# Patient Record
Sex: Female | Born: 1935 | Race: White | Hispanic: No | Smoking: Never smoker
Health system: Southern US, Community
[De-identification: ages and names within clinical notes are randomized; demographics above are authoritative.]

## PROBLEM LIST (undated history)

## (undated) DIAGNOSIS — R42 Dizziness and giddiness: Secondary | ICD-10-CM

## (undated) DIAGNOSIS — G510 Bell's palsy: Secondary | ICD-10-CM

## (undated) DIAGNOSIS — E119 Type 2 diabetes mellitus without complications: Secondary | ICD-10-CM

## (undated) DIAGNOSIS — I639 Cerebral infarction, unspecified: Secondary | ICD-10-CM

## (undated) DIAGNOSIS — I10 Essential (primary) hypertension: Secondary | ICD-10-CM

## (undated) HISTORY — PX: BREAST SURGERY: SHX581

## (undated) HISTORY — PX: TONSILLECTOMY AND ADENOIDECTOMY: SHX28

## (undated) HISTORY — DX: Cerebral infarction, unspecified: I63.9

## (undated) HISTORY — DX: Bell's palsy: G51.0

---

## 1962-04-24 HISTORY — PX: BREAST EXCISIONAL BIOPSY: SUR124

## 1983-04-25 DIAGNOSIS — G510 Bell's palsy: Secondary | ICD-10-CM

## 1983-04-25 HISTORY — DX: Bell's palsy: G51.0

## 2004-02-29 ENCOUNTER — Ambulatory Visit: Payer: Self-pay

## 2005-08-25 ENCOUNTER — Ambulatory Visit: Payer: Self-pay

## 2008-03-18 ENCOUNTER — Ambulatory Visit: Payer: Self-pay

## 2010-02-11 ENCOUNTER — Ambulatory Visit: Payer: Self-pay

## 2012-01-16 ENCOUNTER — Ambulatory Visit: Payer: Self-pay

## 2014-04-08 ENCOUNTER — Ambulatory Visit: Payer: Self-pay

## 2014-08-27 ENCOUNTER — Inpatient Hospital Stay (HOSPITAL_COMMUNITY)
Admission: EM | Admit: 2014-08-27 | Discharge: 2014-09-01 | DRG: 064 | Disposition: A | Discharge status: Home Health | Payer: Medicare Other | Attending: Neurology | Admitting: Neurology

## 2014-08-27 ENCOUNTER — Encounter (HOSPITAL_COMMUNITY): Payer: Self-pay | Admitting: Emergency Medicine

## 2014-08-27 ENCOUNTER — Emergency Department (HOSPITAL_COMMUNITY): Payer: Medicare Other

## 2014-08-27 DIAGNOSIS — I440 Atrioventricular block, first degree: Secondary | ICD-10-CM | POA: Diagnosis present

## 2014-08-27 DIAGNOSIS — G8194 Hemiplegia, unspecified affecting left nondominant side: Secondary | ICD-10-CM | POA: Diagnosis present

## 2014-08-27 DIAGNOSIS — E119 Type 2 diabetes mellitus without complications: Secondary | ICD-10-CM

## 2014-08-27 DIAGNOSIS — I6789 Other cerebrovascular disease: Secondary | ICD-10-CM | POA: Diagnosis not present

## 2014-08-27 DIAGNOSIS — M542 Cervicalgia: Secondary | ICD-10-CM | POA: Diagnosis present

## 2014-08-27 DIAGNOSIS — I442 Atrioventricular block, complete: Secondary | ICD-10-CM | POA: Diagnosis not present

## 2014-08-27 DIAGNOSIS — E785 Hyperlipidemia, unspecified: Secondary | ICD-10-CM | POA: Diagnosis not present

## 2014-08-27 DIAGNOSIS — R001 Bradycardia, unspecified: Secondary | ICD-10-CM | POA: Diagnosis present

## 2014-08-27 DIAGNOSIS — I63012 Cerebral infarction due to thrombosis of left vertebral artery: Secondary | ICD-10-CM | POA: Diagnosis not present

## 2014-08-27 DIAGNOSIS — I7774 Dissection of vertebral artery: Secondary | ICD-10-CM | POA: Diagnosis present

## 2014-08-27 DIAGNOSIS — I6330 Cerebral infarction due to thrombosis of unspecified cerebral artery: Secondary | ICD-10-CM | POA: Diagnosis present

## 2014-08-27 DIAGNOSIS — I63332 Cerebral infarction due to thrombosis of left posterior cerebral artery: Secondary | ICD-10-CM | POA: Diagnosis present

## 2014-08-27 DIAGNOSIS — R42 Dizziness and giddiness: Secondary | ICD-10-CM

## 2014-08-27 DIAGNOSIS — I10 Essential (primary) hypertension: Secondary | ICD-10-CM | POA: Diagnosis not present

## 2014-08-27 DIAGNOSIS — E118 Type 2 diabetes mellitus with unspecified complications: Secondary | ICD-10-CM

## 2014-08-27 DIAGNOSIS — Z79899 Other long term (current) drug therapy: Secondary | ICD-10-CM

## 2014-08-27 DIAGNOSIS — R112 Nausea with vomiting, unspecified: Secondary | ICD-10-CM

## 2014-08-27 DIAGNOSIS — E1142 Type 2 diabetes mellitus with diabetic polyneuropathy: Secondary | ICD-10-CM | POA: Diagnosis present

## 2014-08-27 DIAGNOSIS — E1159 Type 2 diabetes mellitus with other circulatory complications: Secondary | ICD-10-CM | POA: Diagnosis not present

## 2014-08-27 DIAGNOSIS — I6340 Cerebral infarction due to embolism of unspecified cerebral artery: Secondary | ICD-10-CM

## 2014-08-27 HISTORY — DX: Dizziness and giddiness: R42

## 2014-08-27 HISTORY — DX: Essential (primary) hypertension: I10

## 2014-08-27 HISTORY — DX: Type 2 diabetes mellitus without complications: E11.9

## 2014-08-27 LAB — HEPATIC FUNCTION PANEL
ALBUMIN: 3.9 g/dL (ref 3.5–5.0)
ALT: 14 U/L (ref 14–54)
AST: 19 U/L (ref 15–41)
Alkaline Phosphatase: 54 U/L (ref 38–126)
BILIRUBIN DIRECT: 0.2 mg/dL (ref 0.1–0.5)
BILIRUBIN TOTAL: 1 mg/dL (ref 0.3–1.2)
Indirect Bilirubin: 0.8 mg/dL (ref 0.3–0.9)
Total Protein: 6.6 g/dL (ref 6.5–8.1)

## 2014-08-27 LAB — BASIC METABOLIC PANEL
Anion gap: 11 (ref 5–15)
BUN: 16 mg/dL (ref 6–20)
CALCIUM: 9 mg/dL (ref 8.9–10.3)
CHLORIDE: 107 mmol/L (ref 101–111)
CO2: 21 mmol/L — AB (ref 22–32)
CREATININE: 0.78 mg/dL (ref 0.44–1.00)
GFR calc Af Amer: >60 mL/min (ref >60)
Glucose, Bld: 265 mg/dL — ABNORMAL HIGH (ref 70–99)
POTASSIUM: 3.5 mmol/L (ref 3.5–5.1)
Sodium: 139 mmol/L (ref 135–145)

## 2014-08-27 LAB — HEPARIN LEVEL (UNFRACTIONATED): Heparin Unfractionated: 0.27 IU/mL — ABNORMAL LOW (ref 0.30–0.70)

## 2014-08-27 LAB — URINE MICROSCOPIC-ADD ON

## 2014-08-27 LAB — CBC WITH DIFFERENTIAL/PLATELET
BASOS ABS: 0.1 10*3/uL (ref 0.0–0.1)
Basophils Relative: 1 % (ref 0–1)
Eosinophils Absolute: 0.2 10*3/uL (ref 0.0–0.7)
Eosinophils Relative: 1 % (ref 0–5)
HEMATOCRIT: 39.2 % (ref 36.0–46.0)
Hemoglobin: 13.2 g/dL (ref 12.0–15.0)
LYMPHS PCT: 14 % (ref 12–46)
Lymphs Abs: 2.3 10*3/uL (ref 0.7–4.0)
MCH: 29.9 pg (ref 26.0–34.0)
MCHC: 33.7 g/dL (ref 30.0–36.0)
MCV: 88.9 fL (ref 78.0–100.0)
Monocytes Absolute: 0.7 10*3/uL (ref 0.1–1.0)
Monocytes Relative: 4 % (ref 3–12)
NEUTROS PCT: 80 % — AB (ref 43–77)
Neutro Abs: 12.6 10*3/uL — ABNORMAL HIGH (ref 1.7–7.7)
PLATELETS: 226 10*3/uL (ref 150–400)
RBC: 4.41 MIL/uL (ref 3.87–5.11)
RDW: 12.8 % (ref 11.5–15.5)
WBC: 15.7 10*3/uL — AB (ref 4.0–10.5)

## 2014-08-27 LAB — URINALYSIS, ROUTINE W REFLEX MICROSCOPIC
Bilirubin Urine: NEGATIVE
Glucose, UA: >1000 mg/dL — AB
Hgb urine dipstick: NEGATIVE
Ketones, ur: 40 mg/dL — AB
LEUKOCYTES UA: NEGATIVE
Nitrite: NEGATIVE
Protein, ur: NEGATIVE mg/dL
Specific Gravity, Urine: 1.026 (ref 1.005–1.030)
Urobilinogen, UA: 0.2 mg/dL (ref 0.0–1.0)
pH: 6.5 (ref 5.0–8.0)

## 2014-08-27 LAB — PROTIME-INR
INR: 1.05 (ref 0.00–1.49)
PROTHROMBIN TIME: 13.8 s (ref 11.6–15.2)

## 2014-08-27 LAB — GLUCOSE, CAPILLARY
GLUCOSE-CAPILLARY: 146 mg/dL — AB (ref 70–99)
Glucose-Capillary: 145 mg/dL — ABNORMAL HIGH (ref 70–99)
Glucose-Capillary: 150 mg/dL — ABNORMAL HIGH (ref 70–99)
Glucose-Capillary: 165 mg/dL — ABNORMAL HIGH (ref 70–99)

## 2014-08-27 LAB — MAGNESIUM: Magnesium: 1.7 mg/dL (ref 1.7–2.4)

## 2014-08-27 LAB — MRSA PCR SCREENING: MRSA BY PCR: NEGATIVE

## 2014-08-27 LAB — TROPONIN I
Troponin I: <0.03 ng/mL (ref <0.031)
Troponin I: 0.32 ng/mL — ABNORMAL HIGH (ref <0.031)

## 2014-08-27 LAB — LIPASE, BLOOD: Lipase: 33 U/L (ref 22–51)

## 2014-08-27 MED ORDER — DIAZEPAM 5 MG/ML IJ SOLN
2.5000 mg | Freq: Once | INTRAMUSCULAR | Status: AC
  Administered 2014-08-27: 2.5 mg via INTRAVENOUS
  Filled 2014-08-27: qty 2

## 2014-08-27 MED ORDER — ATROPINE SULFATE 0.1 MG/ML IJ SOLN
INTRAMUSCULAR | Status: AC
  Filled 2014-08-27: qty 10

## 2014-08-27 MED ORDER — INSULIN ASPART 100 UNIT/ML ~~LOC~~ SOLN
0.0000 [IU] | Freq: Three times a day (TID) | SUBCUTANEOUS | Status: DC
  Administered 2014-08-27: 3 [IU] via SUBCUTANEOUS
  Administered 2014-08-28: 2 [IU] via SUBCUTANEOUS
  Administered 2014-08-28: 8 [IU] via SUBCUTANEOUS

## 2014-08-27 MED ORDER — POTASSIUM CHLORIDE CRYS ER 20 MEQ PO TBCR
40.0000 meq | EXTENDED_RELEASE_TABLET | Freq: Once | ORAL | Status: AC
  Administered 2014-08-27: 40 meq via ORAL
  Filled 2014-08-27: qty 2

## 2014-08-27 MED ORDER — SENNOSIDES-DOCUSATE SODIUM 8.6-50 MG PO TABS
1.0000 | ORAL_TABLET | Freq: Every evening | ORAL | Status: DC | PRN
  Filled 2014-08-27: qty 1

## 2014-08-27 MED ORDER — HEPARIN (PORCINE) IN NACL 100-0.45 UNIT/ML-% IJ SOLN
950.0000 [IU]/h | INTRAMUSCULAR | Status: DC
  Administered 2014-08-27: 950 [IU]/h via INTRAVENOUS
  Filled 2014-08-27: qty 250

## 2014-08-27 MED ORDER — SODIUM CHLORIDE 0.9 % IV BOLUS (SEPSIS)
500.0000 mL | Freq: Once | INTRAVENOUS | Status: AC
  Administered 2014-08-27: 500 mL via INTRAVENOUS

## 2014-08-27 MED ORDER — IOHEXOL 350 MG/ML SOLN
80.0000 mL | Freq: Once | INTRAVENOUS | Status: AC | PRN
  Administered 2014-08-27: 80 mL via INTRAVENOUS

## 2014-08-27 MED ORDER — STROKE: EARLY STAGES OF RECOVERY BOOK
Freq: Once | Status: DC
  Filled 2014-08-27: qty 1

## 2014-08-27 MED ORDER — MAGNESIUM OXIDE 400 (241.3 MG) MG PO TABS
400.0000 mg | ORAL_TABLET | Freq: Two times a day (BID) | ORAL | Status: AC
  Administered 2014-08-27 – 2014-08-29 (×4): 400 mg via ORAL
  Filled 2014-08-27 (×5): qty 1

## 2014-08-27 NOTE — ED Notes (Signed)
Pt having dry heaves, heart rate dropped to 30's. Dr Johnney Killian at bedside.

## 2014-08-27 NOTE — ED Notes (Signed)
To ED via [facility] from home, with c/o episode of vertigo while on the commode-- hx of vertigo-- became dizzy, diaphoretic, nausea/vomiting/ diarrhea-- felt "a little dizzy when first woke up-- " states is unable to lay down all the way normally because it causes dizziness. States has never had vertigo "this bad".

## 2014-08-27 NOTE — ED Notes (Signed)
Tolerated laying flat for ct without any vomiting.

## 2014-08-27 NOTE — Consult Note (Signed)
Name: Holly [name] is a 79 y.o. female Admit date: 08/27/2014 Referring Physician:  Alexis Goodell, M.D. Primary Physician: Unknown Primary Cardiologist:  None  Reason for Consultation:  Heart block during vomiting  ASSESSMENT:  1. Bradycardia and high-grade AV heart block during vomiting. Patient does have baseline first-degree AV block on EKG 2. Acute vertebral artery dissection 3. Nausea and vomiting 4. Hypertension essential with questionable control over time 5. Diabetes mellitus, type II 6. Abnormal baseline EKG with mild ST elevation in lead 1 and aVL with reciprocal inferior ST segment depression. Rule out chronic baseline changes versus ischemia related  PLAN:  1. Bradycardia/heart block likely newly mediated/vasovagal due to associated nausea and vomiting 2. Treat nausea and vomiting 3. Serial cardiac markers to exclude myocardial injury 4. We will follow with you 5. 2-D Doppler echocardiogram to rule out wall motion abnormality 6. Repeat ECG   HPI: 79 year old patient who awakened with vertigo, gait instability, nausea, vomiting, and left-sided weakness. There was also associated left neck pain. Evaluation is identified a vertebral artery dissection. During episodes of nausea and vomiting the patient has had marked bradycardia with high-grade AV block. There is no prior history of myocardial infarction.. She denies chest pain currently. 4 years ago she did have cardiac evaluation and angiography was recommended but she refused. This workup was directed by Dr. Jyl Heinz in [facility]. Since that time she states an uncle morning's when she walks to the mailbox she will get a burning type sensation in her chest.   PMH:   Past Medical History  Diagnosis Date  . Vertigo   . Hypertension   . Diabetes mellitus without complication     non insulin dependent    PSH:  History reviewed. No pertinent past surgical history. Allergies:  Review of patient's  allergies indicates no known allergies. Prior to Admit Meds:   Prescriptions prior to admission  Medication Sig Dispense Refill Last Dose  . Coenzyme Q10 (CO Q 10) 10 MG CAPS Take 10 mg by mouth daily.   08/26/2014 at Unknown time  . escitalopram (LEXAPRO) 10 MG tablet Take 10 mg by mouth daily.   08/26/2014 at Unknown time  . losartan (COZAAR) 25 MG tablet Take 25 mg by mouth daily.   08/26/2014 at Unknown time  . metFORMIN (GLUCOPHAGE) 500 MG tablet Take 500 mg by mouth 2 (two) times daily with a meal.   08/26/2014 at Unknown time  . Red Yeast Rice 600 MG TABS Take 600 mg by mouth daily.   08/26/2014 at Unknown time   Fam HX:   No family history on file. Social HX:    History   Social History  . Marital Status: Married    Spouse Name: N/A  . Number of Children: N/A  . Years of Education: N/A   Occupational History  . Not on file.   Social History Main Topics  . Smoking status: Never Smoker   . Smokeless tobacco: Not on file  . Alcohol Use: No  . Drug Use: No  . Sexual Activity: Not on file   Other Topics Concern  . Not on file   Social History Narrative  . No narrative on file     Review of Systems: Admits to significant stress at home related to her husband requiring care. She is ordinarily very active at home without difficulty ambulating or dyspnea. No prior history of syncope or heart arrhythmia.  Physical Exam: Blood pressure 163/76, pulse 69, temperature 97.5 F (  36.4 C), temperature source Oral, resp. rate 14, height 5\' 7"  (1.702 m), weight 158 lb 4.6 oz (71.8 kg), SpO2 98 %. Weight change:    Lying comfortably in bed without complaints.  HEENT exam reveals mild ptosis of the right eye.  Neck exam reveals no bruit. No JVD is noted.  Lungs clear to auscultation and percussion  Cardiac exam reveals no gallop or rub  Abdomen is soft. Bowel sounds are normal.  Extremities reveal no edema.  Neurological exam is unremarkable.   Labs: Lab Results  Component Value Date     WBC 15.7* 08/27/2014   HGB 13.2 08/27/2014   HCT 39.2 08/27/2014   MCV 88.9 08/27/2014   PLT 226 08/27/2014    Recent Labs Lab 08/27/14 1020  NA 139  K 3.5  CL 107  CO2 21*  BUN 16  CREATININE 0.78  CALCIUM 9.0  PROT 6.6  BILITOT 1.0  ALKPHOS 54  ALT 14  AST 19  GLUCOSE 265*   No results found for: PTT Lab Results  Component Value Date   INR 1.05 08/27/2014   Lab Results  Component Value Date   TROPONINI <0.03 08/27/2014    No results found for: CHOL No results found for: HDL No results found for: LDLCALC No results found for: TRIG No results found for: CHOLHDL No results found for: LDLDIRECT    Radiology:  Ct Angio Head W/cm &/or Wo Cm  08/27/2014   ADDENDUM REPORT: 08/27/2014 14:20  ADDENDUM: Enlarged, nodular thyroid gland. Consider further evaluation with ultrasound.   Electronically Signed   By: Logan Bores   On: 08/27/2014 14:20   08/27/2014   CLINICAL DATA:  Dizziness, gait instability, nausea, vomiting, and left-sided weakness upon awakening this morning.  EXAM: CT ANGIOGRAPHY HEAD AND NECK  TECHNIQUE: Multidetector CT imaging of the head and neck was performed using the standard protocol during bolus administration of intravenous contrast. Multiplanar CT image reconstructions and MIPs were obtained to evaluate the vascular anatomy. Carotid stenosis measurements (when applicable) are obtained utilizing NASCET criteria, using the distal internal carotid diameter as the denominator.  CONTRAST:  14mL OMNIPAQUE IOHEXOL 350 MG/ML SOLN  COMPARISON:  None  FINDINGS: CT HEAD  Brain: There is a subcentimeter, well-defined focus of hypodensity in the posterior right cerebellar hemisphere most compatible with a chronic infarct. There is no evidence of acute cortical infarct, intracranial hemorrhage, mass, midline shift, or extra-axial fluid collection. Ventricles and sulci are within normal limits for age.  Calvarium and skull base: No destructive skull lesions.  Paranasal  sinuses: Visualized paranasal sinuses are clear. Trace effusions at the mastoid tips.  Orbits: Unremarkable.  CTA NECK  Aortic arch: Normal variant aortic arch branching pattern with common origin of the brachiocephalic and left common carotid arteries. Mild-to-moderate atherosclerotic calcification of the aortic arch and proximal arch vessels. No significant brachiocephalic or right subclavian artery stenosis. Prominent calcification in the proximal left subclavian artery results in up to 50% stenosis.  Right carotid system: Common carotid artery is patent without stenosis. There is mild atherosclerotic calcification in the proximal ICA without stenosis.  Left carotid system: Common carotid artery is patent with a small amount of noncalcified plaque proximally without associated stenosis. Moderate calcification involving the carotid bifurcation and proximal ICA does not result in significant stenosis.  Vertebral arteries: The right vertebral artery is patent and dominant without stenosis. The left vertebral artery is patent proximally but demonstrates progressively diminishing opacification and caliber throughout the V2 segment and becomes completely  occluded at C2.  Skeleton: There is slight anterolisthesis of C4 on C5, likely facet mediated. Moderate disc space narrowing is present C5-6. There is moderate, there is asymmetric, severe left facet arthrosis at C4-5 with mild-to-moderate facet arthrosis at C2-3 and C3 for.  Other neck: The thyroid gland is diffusely heterogeneous with nodule enlargement of the left greater than right lobes, possibly with a 2 cm nodule in the left lobe.  CTA HEAD  Anterior circulation: Internal carotid arteries are patent from skullbase to carotid termini. There is moderate carotid siphon calcification bilaterally without significant stenosis. ACAs and MCAs are unremarkable aside from mild distal branch vessel irregularity. No intracranial aneurysm is identified.  Posterior  circulation: The intracranial right vertebral artery is patent without stenosis and supplies the basilar. There is a small amount of retrograde opacification of the distal left vertebral artery. Right PICA may share a common origin with the right AICA. Left PICA appears occluded. SCA origins are patent. Basilar artery is patent without stenosis. PCAs are patent with mild irregularity bilaterally but no significant proximal stenosis. There may be tiny bilateral posterior communicating arteries.  Venous sinuses: Patent.  Anatomic variants: None.  Delayed phase: No abnormal enhancement.  IMPRESSION: 1. 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 Acute occlusion, including from a dissection, is possible. 2. Patent and dominant right vertebral artery. 3. No cervical carotid artery stenosis. 4. Intracranial atherosclerosis without significant proximal stenosis. No major intracranial arterial occlusion. 5. 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  These results were called by telephone at the time of interpretation on 08/27/2014 at 1:25 pm to Dr. Doy Mince, who verbally acknowledged these results.  Electronically Signed: By: Logan Bores On: 08/27/2014 13:31   Ct Angio Neck W/cm &/or Wo/cm  08/27/2014   ADDENDUM REPORT: 08/27/2014 14:20  ADDENDUM: Enlarged, nodular thyroid gland. Consider further evaluation with ultrasound.   Electronically Signed   By: Logan Bores   On: 08/27/2014 14:20   08/27/2014   CLINICAL DATA:  Dizziness, gait instability, nausea, vomiting, and left-sided weakness upon awakening this morning.  EXAM: CT ANGIOGRAPHY HEAD AND NECK  TECHNIQUE: Multidetector CT imaging of the head and neck was performed using the standard protocol during bolus administration of intravenous contrast. Multiplanar CT image reconstructions and MIPs were obtained to evaluate the vascular anatomy. Carotid stenosis measurements (when applicable) are obtained utilizing NASCET criteria, using the distal  internal carotid diameter as the denominator.  CONTRAST:  65mL OMNIPAQUE IOHEXOL 350 MG/ML SOLN  COMPARISON:  None  FINDINGS: CT HEAD  Brain: There is a subcentimeter, well-defined focus of hypodensity in the posterior right cerebellar hemisphere most compatible with a chronic infarct. There is no evidence of acute cortical infarct, intracranial hemorrhage, mass, midline shift, or extra-axial fluid collection. Ventricles and sulci are within normal limits for age.  Calvarium and skull base: No destructive skull lesions.  Paranasal sinuses: Visualized paranasal sinuses are clear. Trace effusions at the mastoid tips.  Orbits: Unremarkable.  CTA NECK  Aortic arch: Normal variant aortic arch branching pattern with common origin of the brachiocephalic and left common carotid arteries. Mild-to-moderate atherosclerotic calcification of the aortic arch and proximal arch vessels. No significant brachiocephalic or right subclavian artery stenosis. Prominent calcification in the proximal left subclavian artery results in up to 50% stenosis.  Right carotid system: Common carotid artery is patent without stenosis. There is mild atherosclerotic calcification in the proximal ICA without stenosis.  Left carotid system: Common carotid artery is patent with a small amount  of noncalcified plaque proximally without associated stenosis. Moderate calcification involving the carotid bifurcation and proximal ICA does not result in significant stenosis.  Vertebral arteries: The right vertebral artery is patent and dominant without stenosis. The left vertebral artery is patent proximally but demonstrates progressively diminishing opacification and caliber throughout the V2 segment and becomes completely occluded at C2.  Skeleton: There is slight anterolisthesis of C4 on C5, likely facet mediated. Moderate disc space narrowing is present C5-6. There is moderate, there is asymmetric, severe left facet arthrosis at C4-5 with mild-to-moderate  facet arthrosis at C2-3 and C3 for.  Other neck: The thyroid gland is diffusely heterogeneous with nodule enlargement of the left greater than right lobes, possibly with a 2 cm nodule in the left lobe.  CTA HEAD  Anterior circulation: Internal carotid arteries are patent from skullbase to carotid termini. There is moderate carotid siphon calcification bilaterally without significant stenosis. ACAs and MCAs are unremarkable aside from mild distal branch vessel irregularity. No intracranial aneurysm is identified.  Posterior circulation: The intracranial right vertebral artery is patent without stenosis and supplies the basilar. There is a small amount of retrograde opacification of the distal left vertebral artery. Right PICA may share a common origin with the right AICA. Left PICA appears occluded. SCA origins are patent. Basilar artery is patent without stenosis. PCAs are patent with mild irregularity bilaterally but no significant proximal stenosis. There may be tiny bilateral posterior communicating arteries.  Venous sinuses: Patent.  Anatomic variants: None.  Delayed phase: No abnormal enhancement.  IMPRESSION: 1. 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 Acute occlusion, including from a dissection, is possible. 2. Patent and dominant right vertebral artery. 3. No cervical carotid artery stenosis. 4. Intracranial atherosclerosis without significant proximal stenosis. No major intracranial arterial occlusion. 5. 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  These results were called by telephone at the time of interpretation on 08/27/2014 at 1:25 pm to Dr. Doy Mince, who verbally acknowledged these results.  Electronically Signed: By: Logan Bores On: 08/27/2014 13:31    EKG:  Sinus bradycardia, borderline QT interval, and first-degree AV block. There is a suggestion of mild ST elevation in 1 and aVL with her Cipro inferior ST segment depression.     Sinclair Grooms 08/27/2014 5:04  PM

## 2014-08-27 NOTE — Progress Notes (Signed)
Pt continues to be NPO with pending stroke swallow screen. Pt reports improvement in nausea but wants to wait awhile before attempting to eat or drink anything.   Holly [name]

## 2014-08-27 NOTE — Progress Notes (Signed)
Called to review f/u EKG. NSR, 1 st degree AVB, QTc 497, no significant ST changes. With prolonged QTc wil giver her K+ (3.5) and Mg++ (1.7).  Kerin Ransom PA-C 08/27/2014 7:36 PM

## 2014-08-27 NOTE — ED Notes (Signed)
Returned from ct, became nauseous-- hheart rate

## 2014-08-27 NOTE — ED Provider Notes (Signed)
CSN: [id number]     Arrival date & time 08/27/14  0933 History   First MD Initiated Contact with Patient 08/27/14 [phone]     Chief Complaint  Patient presents with  . Dizziness     (Consider location/radiation/quality/duration/timing/severity/associated sxs/prior Treatment) HPI The patient reports she awoke at 8 AM and and when she got out of bed she felt intensely dizzy with a spinning quality. She states it almost made her pass out. She notes that as well she perceived a slight amount of left sided weakness in her arm and her leg. She reports she's had vertigo in the past and number of times but doesn't take medication for it. She states she went to bed feeling well yesterday evening. Symptoms are improved if she keeps her head very still and keeps her eyes open. Lying flat bring symptoms on very intensely. She does report that as a baseline she can't lie completely flat or she will become very dizzy. Therefore she usually sleeps with a couple pillows supporting her head. Past Medical History  Diagnosis Date  . Vertigo   . Hypertension   . Diabetes mellitus without complication     non insulin dependent   History reviewed. No pertinent past surgical history. No family history on file. History  Substance Use Topics  . Smoking status: Never Smoker   . Smokeless tobacco: Not on file  . Alcohol Use: No   OB History    No data available     Review of Systems 10 Systems reviewed and are negative for acute change except as noted in the HPI.    Allergies  Review of patient's allergies indicates no known allergies.  Home Medications   Prior to Admission medications   Medication Sig Start Date End Date Taking? Authorizing Provider  Coenzyme Q10 (CO Q 10) 10 MG CAPS Take 10 mg by mouth daily.   Yes Historical Provider, MD  escitalopram (LEXAPRO) 10 MG tablet Take 10 mg by mouth daily.   Yes Historical Provider, MD  losartan (COZAAR) 25 MG tablet Take 25 mg by mouth daily.   Yes  Historical Provider, MD  metFORMIN (GLUCOPHAGE) 500 MG tablet Take 500 mg by mouth 2 (two) times daily with a meal.   Yes Historical Provider, MD  Red Yeast Rice 600 MG TABS Take 600 mg by mouth daily.   Yes Historical Provider, MD   BP 151/49 mmHg  Pulse 64  Resp 16  Ht 5\' 7"  (1.702 m)  Wt 169 lb (76.658 kg)  BMI 26.46 kg/m2  SpO2 98% Physical Exam  Constitutional: She is oriented to person, place, and time.  The patient is sitting upright in the stretcher. She is keeping her eyes closed. She appears uncomfortable. She is awake and alert. She is mildly pale and diaphoretic.  HENT:  Head: Normocephalic and atraumatic.  Eyes: Conjunctivae and EOM are normal. Pupils are equal, round, and reactive to light. Right eye exhibits no discharge. Left eye exhibits no discharge.  Cardiovascular: Normal rate, regular rhythm, normal heart sounds and intact distal pulses.   Pulmonary/Chest: Effort normal and breath sounds normal. No respiratory distress.  Abdominal: Soft. Bowel sounds are normal. She exhibits no distension.  Musculoskeletal: She exhibits no edema or tenderness.  Neurological: She is alert and oriented to person, place, and time.  The patient has a mild right sided lip droop. 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 Patient's airway is patent. Bilateral grip strength and push pull  is symmetric by exam. Subjectively the patient feels some weakness on the left and subjectively some decreased sensation to light touch on the left. Lower extremities patient can elevate both off the bed independently and hold. She has intact flexion and extension bilaterally lower extremities. She does endorse slight difference to light touch on the left versus right.  Skin: There is pallor.  Diaphoretic  Psychiatric: She has a normal mood and affect.   with an episode of vomiting during the examination, the patient developed a vagal bradycardia. As soon as the vagal maneuver  stopped the heart rate resumed to a sinus rhythm. This was recurrent with episodes of Valsalva with trying to vomit. As soon as he symptoms were controlled the heart rate again was sinus rhythm.  ED Course  Procedures (including critical care time) Labs Review Labs Reviewed  BASIC METABOLIC PANEL - Abnormal; Notable for the following:    CO2 21 (*)    Glucose, Bld 265 (*)    All other components within normal limits  CBC WITH DIFFERENTIAL/PLATELET - Abnormal; Notable for the following:    WBC 15.7 (*)    Neutrophils Relative % 80 (*)    Neutro Abs 12.6 (*)    All other components within normal limits  URINALYSIS, ROUTINE W REFLEX MICROSCOPIC - Abnormal; Notable for the following:    Glucose, UA >1000 (*)    Ketones, ur 40 (*)    All other components within normal limits  HEPATIC FUNCTION PANEL  LIPASE, BLOOD  TROPONIN I  PROTIME-INR  MAGNESIUM  URINE MICROSCOPIC-ADD ON  HEPARIN LEVEL (UNFRACTIONATED)    Imaging Review Ct Angio Head W/cm &/or Wo Cm  08/27/2014   ADDENDUM REPORT: 08/27/2014 14:20  ADDENDUM: Enlarged, nodular thyroid gland. Consider further evaluation with ultrasound.   Electronically Signed   By: Logan Bores   On: 08/27/2014 14:20   08/27/2014   CLINICAL DATA:  Dizziness, gait instability, nausea, vomiting, and left-sided weakness upon awakening this morning.  EXAM: CT ANGIOGRAPHY HEAD AND NECK  TECHNIQUE: Multidetector CT imaging of the head and neck was performed using the standard protocol during bolus administration of intravenous contrast. Multiplanar CT image reconstructions and MIPs were obtained to evaluate the vascular anatomy. Carotid stenosis measurements (when applicable) are obtained utilizing NASCET criteria, using the distal internal carotid diameter as the denominator.  CONTRAST:  47mL OMNIPAQUE IOHEXOL 350 MG/ML SOLN  COMPARISON:  None  FINDINGS: CT HEAD  Brain: There is a subcentimeter, well-defined focus of hypodensity in the posterior right cerebellar  hemisphere most compatible with a chronic infarct. There is no evidence of acute cortical infarct, intracranial hemorrhage, mass, midline shift, or extra-axial fluid collection. Ventricles and sulci are within normal limits for age.  Calvarium and skull base: No destructive skull lesions.  Paranasal sinuses: Visualized paranasal sinuses are clear. Trace effusions at the mastoid tips.  Orbits: Unremarkable.  CTA NECK  Aortic arch: Normal variant aortic arch branching pattern with common origin of the brachiocephalic and left common carotid arteries. Mild-to-moderate atherosclerotic calcification of the aortic arch and proximal arch vessels. No significant brachiocephalic or right subclavian artery stenosis. Prominent calcification in the proximal left subclavian artery results in up to 50% stenosis.  Right carotid system: Common carotid artery is patent without stenosis. There is mild atherosclerotic calcification in the proximal ICA without stenosis.  Left carotid system: Common carotid artery is patent with a small amount of noncalcified plaque proximally without associated stenosis. Moderate calcification involving the carotid bifurcation and proximal ICA does  not result in significant stenosis.  Vertebral arteries: The right vertebral artery is patent and dominant without stenosis. The left vertebral artery is patent proximally but demonstrates progressively diminishing opacification and caliber throughout the V2 segment and becomes completely occluded at C2.  Skeleton: There is slight anterolisthesis of C4 on C5, likely facet mediated. Moderate disc space narrowing is present C5-6. There is moderate, there is asymmetric, severe left facet arthrosis at C4-5 with mild-to-moderate facet arthrosis at C2-3 and C3 for.  Other neck: The thyroid gland is diffusely heterogeneous with nodule enlargement of the left greater than right lobes, possibly with a 2 cm nodule in the left lobe.  CTA HEAD  Anterior circulation:  Internal carotid arteries are patent from skullbase to carotid termini. There is moderate carotid siphon calcification bilaterally without significant stenosis. ACAs and MCAs are unremarkable aside from mild distal branch vessel irregularity. No intracranial aneurysm is identified.  Posterior circulation: The intracranial right vertebral artery is patent without stenosis and supplies the basilar. There is a small amount of retrograde opacification of the distal left vertebral artery. Right PICA may share a common origin with the right AICA. Left PICA appears occluded. SCA origins are patent. Basilar artery is patent without stenosis. PCAs are patent with mild irregularity bilaterally but no significant proximal stenosis. There may be tiny bilateral posterior communicating arteries.  Venous sinuses: Patent.  Anatomic variants: None.  Delayed phase: No abnormal enhancement.  IMPRESSION: 1. 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 Acute occlusion, including from a dissection, is possible. 2. Patent and dominant right vertebral artery. 3. No cervical carotid artery stenosis. 4. Intracranial atherosclerosis without significant proximal stenosis. No major intracranial arterial occlusion. 5. 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  These results were called by telephone at the time of interpretation on 08/27/2014 at 1:25 pm to Dr. Doy Mince, who verbally acknowledged these results.  Electronically Signed: By: Logan Bores On: 08/27/2014 13:31   Ct Angio Neck W/cm &/or Wo/cm  08/27/2014   ADDENDUM REPORT: 08/27/2014 14:20  ADDENDUM: Enlarged, nodular thyroid gland. Consider further evaluation with ultrasound.   Electronically Signed   By: Logan Bores   On: 08/27/2014 14:20   08/27/2014   CLINICAL DATA:  Dizziness, gait instability, nausea, vomiting, and left-sided weakness upon awakening this morning.  EXAM: CT ANGIOGRAPHY HEAD AND NECK  TECHNIQUE: Multidetector CT imaging of the head and neck was  performed using the standard protocol during bolus administration of intravenous contrast. Multiplanar CT image reconstructions and MIPs were obtained to evaluate the vascular anatomy. Carotid stenosis measurements (when applicable) are obtained utilizing NASCET criteria, using the distal internal carotid diameter as the denominator.  CONTRAST:  14mL OMNIPAQUE IOHEXOL 350 MG/ML SOLN  COMPARISON:  None  FINDINGS: CT HEAD  Brain: There is a subcentimeter, well-defined focus of hypodensity in the posterior right cerebellar hemisphere most compatible with a chronic infarct. There is no evidence of acute cortical infarct, intracranial hemorrhage, mass, midline shift, or extra-axial fluid collection. Ventricles and sulci are within normal limits for age.  Calvarium and skull base: No destructive skull lesions.  Paranasal sinuses: Visualized paranasal sinuses are clear. Trace effusions at the mastoid tips.  Orbits: Unremarkable.  CTA NECK  Aortic arch: Normal variant aortic arch branching pattern with common origin of the brachiocephalic and left common carotid arteries. Mild-to-moderate atherosclerotic calcification of the aortic arch and proximal arch vessels. No significant brachiocephalic or right subclavian artery stenosis. Prominent calcification in the proximal left subclavian artery results in  up to 50% stenosis.  Right carotid system: Common carotid artery is patent without stenosis. There is mild atherosclerotic calcification in the proximal ICA without stenosis.  Left carotid system: Common carotid artery is patent with a small amount of noncalcified plaque proximally without associated stenosis. Moderate calcification involving the carotid bifurcation and proximal ICA does not result in significant stenosis.  Vertebral arteries: The right vertebral artery is patent and dominant without stenosis. The left vertebral artery is patent proximally but demonstrates progressively diminishing opacification and caliber  throughout the V2 segment and becomes completely occluded at C2.  Skeleton: There is slight anterolisthesis of C4 on C5, likely facet mediated. Moderate disc space narrowing is present C5-6. There is moderate, there is asymmetric, severe left facet arthrosis at C4-5 with mild-to-moderate facet arthrosis at C2-3 and C3 for.  Other neck: The thyroid gland is diffusely heterogeneous with nodule enlargement of the left greater than right lobes, possibly with a 2 cm nodule in the left lobe.  CTA HEAD  Anterior circulation: Internal carotid arteries are patent from skullbase to carotid termini. There is moderate carotid siphon calcification bilaterally without significant stenosis. ACAs and MCAs are unremarkable aside from mild distal branch vessel irregularity. No intracranial aneurysm is identified.  Posterior circulation: The intracranial right vertebral artery is patent without stenosis and supplies the basilar. There is a small amount of retrograde opacification of the distal left vertebral artery. Right PICA may share a common origin with the right AICA. Left PICA appears occluded. SCA origins are patent. Basilar artery is patent without stenosis. PCAs are patent with mild irregularity bilaterally but no significant proximal stenosis. There may be tiny bilateral posterior communicating arteries.  Venous sinuses: Patent.  Anatomic variants: None.  Delayed phase: No abnormal enhancement.  IMPRESSION: 1. 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 Acute occlusion, including from a dissection, is possible. 2. Patent and dominant right vertebral artery. 3. No cervical carotid artery stenosis. 4. Intracranial atherosclerosis without significant proximal stenosis. No major intracranial arterial occlusion. 5. 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  These results were called by telephone at the time of interpretation on 08/27/2014 at 1:25 pm to Dr. Doy Mince, who verbally acknowledged these results.   Electronically Signed: By: Logan Bores On: 08/27/2014 13:31     EKG Interpretation   Date/Time:  Thursday Aug 27 2014 09:39:30 EDT Ventricular Rate:  61 PR Interval:  273 QRS Duration: 87 QT Interval:  483 QTC Calculation: 487 R Axis:   50 Text Interpretation:  Sinus rhythm Atrial premature complex Prolonged PR  interval Minimal ST depression, diffuse leads Borderline prolonged QT  interval agree. no STEMI Confirmed by Johnney Killian, MD, Jeannie Done [phone]) on  08/27/2014 9:47:23 AM     Recheck: 1105 patient has had some improvement with Valium. At this time being still she is not having active vertigo. She reports that she did have a "spell" a little while ago. Consult: Dr. Janann Colonel neurology was consult regarding the patient's symptoms with suspicion for stroke with vertigo. Dr. Doy Mince subsequently completed the evaluation and admission.  CRITICAL CARE Performed by: Charlesetta Shanks   Total critical care time: 60  Critical care time was exclusive of separately billable procedures and treating other patients.  Critical care was necessary to treat or prevent imminent or life-threatening deterioration.  Critical care was time spent personally by me on the following activities: development of treatment plan with patient and/or surrogate as well as nursing, discussions with consultants, evaluation of patient's response to treatment,  examination of patient, obtaining history from patient or surrogate, ordering and performing treatments and interventions, ordering and review of laboratory studies, ordering and review of radiographic studies, pulse oximetry and re-evaluation of patient's condition. 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 She also identified some left-sided neurologic symptoms. Physical exam did not identify motor deficit however subjectively there was  diminished sensation. CTA has identified a vertebral basilar or dissection/occlusion. Patient symptoms were somewhat improved with the use of Valium. She did have episodes of heart block with valsalva maneuver while vomining however this immediately resolved as soon as they have SL and maneuver was stopped. No atropine was required.    Charlesetta Shanks, MD 08/27/14 [phone]

## 2014-08-27 NOTE — ED Notes (Signed)
Report given to 3M RN. 

## 2014-08-27 NOTE — Consult Note (Addendum)
Stroke Consult    Chief Complaint: vertigo, nausea, left sided weakness  HPI: Holly [name] is an 79 y.o. female hx of HTN, DM presenting after waking up with symptoms of dizziness (described as vertigo), gait instability, nausea/vomitting and left sided weakness. Reports LSW midnight 5/04 when she went to the bathroom with no difficulty. Woke up this morning and noted symptoms as soon as waking up.   Of note, patient had severe bradycardic episode in the ED while having a bout of emesis.   Also notes pain on the left side of her lateral/posterior cervical neck region. Reports a multi-year history of similar (though less severe) vertigo and nausea anytime she lays flat and/or extends her neck back.   Date last known well: 08/26/2014 Time last known well: midnight tPA Given: no, outside of IV tPA window  Past Medical History  Diagnosis Date  . Vertigo   . Hypertension   . Diabetes mellitus without complication     non insulin dependent    History reviewed. No pertinent past surgical history.  No family history on file. Social History:  reports that she has never smoked. She does not have any smokeless tobacco history on file. She reports that she does not drink alcohol or use illicit drugs.  Allergies: Allergies not on file   (Not in a hospital admission)  ROS: Out of a complete 14 system review, the patient complains of only the following symptoms, and all other reviewed systems are negative. +dizziness, nausea, emesis, weakness   Physical Examination: There were no vitals filed for this visit. Physical Exam  Constitutional: mild distress, uncomfortable Psych: Affect appropriate to situation Eyes: No scleral injection HENT: No OP obstrucion Head: Normocephalic.  Cardiovascular: Normal rate and regular rhythm.  Respiratory: Effort normal and breath sounds normal.  GI: Soft. Bowel sounds are normal. No distension. There is no tenderness.  Skin: WDI   Neurologic  Examination: Mental Status: Alert, oriented, thought content appropriate.  Speech fluent without evidence of aphasia.  Mild dysarthria. Able to follow 3 step commands without difficulty. Cranial Nerves: II: funduscopic exam wnl bilaterally, visual fields grossly normal, pupils equal, round, reactive to light and accommodation III,IV, VI: ptosis not present, extra-ocular motions intact bilaterally V,VII: smile symmetric, facial light touch sensation normal bilaterally VIII: hearing normal bilaterally IX,X: gag reflex present XI: trapezius strength/neck flexion strength normal bilaterally XII: tongue strength normal  Motor: Right : Upper extremity    Left:     Upper extremity 5-/5 deltoid       5-/5 deltoid 5/5 biceps      5-/5 biceps  5/5 triceps      5/5 triceps 5/5 hand grip      5/5 hand grip  Lower extremity     Lower extremity 5-/5 hip flexor      4+/5 hip flexor 5/5 quadricep      5-/5 quadriceps  5/5 hamstrings     5-/5 hamstrings 5/5 plantar flexion       5/5 plantar flexion 5/5 plantar extension     5/5 plantar extension Tone and bulk:normal tone throughout; no atrophy noted Sensory: Pinprick and light touch intact throughout, bilaterally Deep Tendon Reflexes: 2+ and symmetric throughout Plantars: Right: downgoing   Left: downgoing Cerebellar: Noted dysmetria with FTN on the left and to a lesser extent with HTS on the left Gait: deferred due to nausea with movement  Laboratory Studies:   Basic Metabolic Panel: No results for input(s): NA, K, CL, CO2, GLUCOSE, BUN, CREATININE,  CALCIUM, MG, PHOS in the last 168 hours.  Liver Function Tests: No results for input(s): AST, ALT, ALKPHOS, BILITOT, PROT, ALBUMIN in the last 168 hours. No results for input(s): LIPASE, AMYLASE in the last 168 hours. No results for input(s): AMMONIA in the last 168 hours.  CBC: No results for input(s): WBC, NEUTROABS, HGB, HCT, MCV, PLT in the last 168 hours.  Cardiac Enzymes: No results for  input(s): CKTOTAL, CKMB, CKMBINDEX, TROPONINI in the last 168 hours.  BNP: Invalid input(s): POCBNP  CBG: No results for input(s): GLUCAP in the last 168 hours.  Microbiology: No results found for this or any previous visit.  Coagulation Studies: No results for input(s): LABPROT, INR in the last 72 hours.  Urinalysis: No results for input(s): COLORURINE, LABSPEC, PHURINE, GLUCOSEU, HGBUR, BILIRUBINUR, KETONESUR, PROTEINUR, UROBILINOGEN, NITRITE, LEUKOCYTESUR in the last 168 hours.  Invalid input(s): APPERANCEUR  Lipid Panel:  No results found for: CHOL, TRIG, HDL, CHOLHDL, VLDL, LDLCALC  HgbA1C: No results found for: HGBA1C  Urine Drug Screen:  No results found for: LABOPIA, COCAINSCRNUR, LABBENZ, AMPHETMU, THCU, LABBARB  Alcohol Level: No results for input(s): ETH in the last 168 hours.  Other results: EKG: normal sinus rhythm.  Imaging: No results found.  Assessment: 79 y.o. female hx of HTN, DM presenting after waking up with vertigo, gait instability, nausea/emesis and subjective left sided weakness. Exam pertinent for uncomfortable patient with symptoms exacerbated by movement and dysmetria on the left side. Differential would be CVA (likely cerebellar) vs peripheral vestibular lesion. Report of neck pain on the left raises question of possible vertebral dissection.   Per RN patient noted to have suspected 3rd degree heart block with HR in the 30s during recent episode of nausea and emesis. Question if vagal effect of nausea/vomitting or potentially may be the contributing factor of her presentation.   Stroke Risk Factors - HTN, DM  Plan: 1) Will check CT angiogram of head and neck. Further workup pending results of angiogram.  2) May need to consider cardiology consult if stroke workup unremarkable.    Jim Like, DO [facility] [phone]  If 7pm- 7am, please page neurology on call as listed in AMION. 08/27/2014, 10:27 AM

## 2014-08-27 NOTE — ED Notes (Signed)
Pt vomiting, heart rate drops to 30's, with 3rd degree block-- returns to normal when vomiting passes

## 2014-08-27 NOTE — H&P (Signed)
Addendum: CTA of head and neck reviewed with radiology and reveals what appears to be a left vertebral dissection.  No acute infarct noted.  Option for cerebral angiogram and possible intervention discussed with patient and daughter.  Patient refused catheter angio at this time.  Upon discussion with Dr. Leonie Man patient to be admitted to the NICU and IV heparin to be initiated.  MRI of the brain to be performed.  Patient with heart block when vomiting.  Cardiology to be contacted.    Alexis Goodell, MD [facility] [phone]  08/27/2014  2:17 PM

## 2014-08-27 NOTE — ED Notes (Signed)
Please hold Type and screen until CT resulted.

## 2014-08-27 NOTE — Progress Notes (Signed)
eLink Physician-Brief Progress Note Patient Name: Holly [name] DOB: 10-05-1935 MRN: [id number]   Date of Service  08/27/2014  HPI/Events of Note  New pt evaluation, camera in m, comfortable, no distress, not brady now Here with dissection carotid Pt has refused intervention at this stage  eICU Interventions  Will contact neuro to decide on BP control recs Heparin on board     Intervention Category Evaluation Type: New Patient Evaluation  Raylene Miyamoto. 08/27/2014, 5:05 PM

## 2014-08-27 NOTE — Progress Notes (Signed)
ANTICOAGULATION CONSULT NOTE - Initial Consult  Pharmacy Consult for Heparin Indication: Vertebral dissection  No Known Allergies  Patient Measurements: Height: 5\' 7"  (170.2 cm) Weight: 169 lb (76.658 kg) IBW/kg (Calculated) : 61.6  Vital Signs:    Labs:  Recent Labs  08/27/14 1020  HGB 13.2  HCT 39.2  PLT 226  LABPROT 13.8  INR 1.05  CREATININE 0.78  TROPONINI <0.03    CrCl cannot be calculated (Unknown ideal weight.).   Medical History: Past Medical History  Diagnosis Date  . Vertigo   . Hypertension   . Diabetes mellitus without complication     non insulin dependent    Medications:   (Not in a hospital admission)  Assessment: 79 yo F presenting to the ED on 08/27/2014 with vertigo, nausea and left sided weakness. CT scan c/w vertebral dissection and pharmacy consulted to dose heparin without bolus. SCr wnl at 0.78 (CrCl ~57 ml/min), CBC wnl with no reported significant s/s bleeding reported.   Goal of Therapy:  Heparin level 0.3-0.7 units/ml Monitor platelets by anticoagulation protocol: Yes   Plan:  - Initiate heparin gtt at 950 units/hr without bolus (~12-13 u/kg/hr) - F/u 8 hour HL - Daily HL/CBC - Monitor for s/s bleeding  Florence Yeung K. Velva Harman, PharmD, [place] Clinical Pharmacist - Resident Pager: [phone] Pharmacy: [phone] 08/27/2014 2:31 PM

## 2014-08-28 ENCOUNTER — Inpatient Hospital Stay (HOSPITAL_COMMUNITY): Payer: Medicare Other

## 2014-08-28 DIAGNOSIS — I440 Atrioventricular block, first degree: Secondary | ICD-10-CM | POA: Diagnosis present

## 2014-08-28 DIAGNOSIS — R001 Bradycardia, unspecified: Secondary | ICD-10-CM

## 2014-08-28 DIAGNOSIS — I6789 Other cerebrovascular disease: Secondary | ICD-10-CM

## 2014-08-28 LAB — CBC
HCT: 38.3 % (ref 36.0–46.0)
HEMOGLOBIN: 12.8 g/dL (ref 12.0–15.0)
MCH: 29.7 pg (ref 26.0–34.0)
MCHC: 33.4 g/dL (ref 30.0–36.0)
MCV: 88.9 fL (ref 78.0–100.0)
Platelets: 194 10*3/uL (ref 150–400)
RBC: 4.31 MIL/uL (ref 3.87–5.11)
RDW: 12.8 % (ref 11.5–15.5)
WBC: 14.3 10*3/uL — ABNORMAL HIGH (ref 4.0–10.5)

## 2014-08-28 LAB — TROPONIN I
TROPONIN I: 0.5 ng/mL — AB (ref <0.031)
Troponin I: 0.47 ng/mL — ABNORMAL HIGH (ref <0.031)
Troponin I: 0.32 ng/mL — ABNORMAL HIGH (ref <0.031)

## 2014-08-28 LAB — HEPARIN LEVEL (UNFRACTIONATED): Heparin Unfractionated: 0.53 IU/mL (ref 0.30–0.70)

## 2014-08-28 LAB — LIPID PANEL
CHOL/HDL RATIO: 4.3 ratio
CHOLESTEROL: 232 mg/dL — AB (ref 0–200)
HDL: 54 mg/dL (ref >40)
LDL Cholesterol: 159 mg/dL — ABNORMAL HIGH (ref 0–99)
Triglycerides: 97 mg/dL (ref <150)
VLDL: 19 mg/dL (ref 0–40)

## 2014-08-28 LAB — GLUCOSE, CAPILLARY
GLUCOSE-CAPILLARY: 145 mg/dL — AB (ref 70–99)
GLUCOSE-CAPILLARY: 229 mg/dL — AB (ref 70–99)
Glucose-Capillary: 251 mg/dL — ABNORMAL HIGH (ref 70–99)
Glucose-Capillary: 134 mg/dL — ABNORMAL HIGH (ref 70–99)

## 2014-08-28 MED ORDER — ESCITALOPRAM OXALATE 10 MG PO TABS
10.0000 mg | ORAL_TABLET | Freq: Every day | ORAL | Status: DC
  Administered 2014-08-29 – 2014-09-01 (×3): 10 mg via ORAL
  Filled 2014-08-28 (×4): qty 1

## 2014-08-28 MED ORDER — CLOPIDOGREL BISULFATE 75 MG PO TABS
75.0000 mg | ORAL_TABLET | Freq: Every day | ORAL | Status: DC
  Administered 2014-08-28 – 2014-09-01 (×5): 75 mg via ORAL
  Filled 2014-08-28 (×5): qty 1

## 2014-08-28 MED ORDER — ATORVASTATIN CALCIUM 40 MG PO TABS
40.0000 mg | ORAL_TABLET | Freq: Every day | ORAL | Status: DC
  Administered 2014-08-28 – 2014-08-31 (×4): 40 mg via ORAL
  Filled 2014-08-28 (×5): qty 1

## 2014-08-28 MED ORDER — ASPIRIN 81 MG PO CHEW
81.0000 mg | CHEWABLE_TABLET | Freq: Every day | ORAL | Status: DC
  Administered 2014-08-28 – 2014-09-01 (×5): 81 mg via ORAL
  Filled 2014-08-28 (×5): qty 1

## 2014-08-28 MED ORDER — HEPARIN (PORCINE) IN NACL 100-0.45 UNIT/ML-% IJ SOLN
1050.0000 [IU]/h | INTRAMUSCULAR | Status: DC
  Filled 2014-08-28 (×2): qty 250

## 2014-08-28 NOTE — Progress Notes (Signed)
  Echocardiogram 2D Echocardiogram has been performed.  Holly [name] 08/28/2014, 12:29 PM

## 2014-08-28 NOTE — Progress Notes (Signed)
OT Cancellation Note  Patient Details Name: Holly [name] MRN: [id number] DOB: June 07, 1935   Cancelled Treatment:    Reason Eval/Treat Not Completed: Patient not medically ready Pt on bedrest. Troponins elevated. Please update activity orders when appropriate for therapy. Thanks Thrall, OTR/L  [phone] 08/28/2014 08/28/2014, 6:52 AM

## 2014-08-28 NOTE — Progress Notes (Signed)
[facility] for heparin Indication: vertebral artery dissection  No Known Allergies  Patient Measurements: Height: 5\' 7"  (170.2 cm) Weight: 158 lb 4.6 oz (71.8 kg) (change in bed) IBW/kg (Calculated) : 61.6 Heparin Dosing Weight: 71.8 kg  Vital Signs: Temp: 98.5 F (36.9 C) (05/06 0759) Temp Source: Oral (05/06 0759) BP: 166/Holly mmHg (05/06 0600) Pulse Rate: 73 (05/06 0600)  Labs:  Recent Labs  08/27/14 1020 08/27/14 1850 08/27/14 2158 08/27/14 2327 08/28/14 0739  HGB 13.2  --   --   --  12.8  HCT 39.2  --   --   --  38.3  PLT 226  --   --   --  194  LABPROT 13.8  --   --   --   --   INR 1.05  --   --   --   --   HEPARINUNFRC  --   --  0.27*  --  0.53  CREATININE 0.78  --   --   --   --   TROPONINI <0.03 0.32*  --  0.50* 0.47*    Estimated Creatinine Clearance: 57.3 mL/min (by C-G formula based on Cr of 0.78).   Medical History: Past Medical History  Diagnosis Date  . Vertigo   . Hypertension   . Diabetes mellitus without complication     non insulin dependent    Medications:  Infusions:  . heparin 1,050 Units/hr (08/28/14 0029)    Assessment: 79 yo [name] admitted with vertigo, nausea, L-sided weakness. CT scan shows vertebral artery dissection. HL therapeutic x1, CBC stable and wnl.  Goal of Therapy:  Heparin level 0.3-0.7 units/ml Monitor platelets by anticoagulation protocol: Yes   Plan:  -Continue heparin at 1050 units/hr  -Daily HL, CBC -Monitor s/sx bleeding -F/u transition to PO anticoag   Hughes Better, PharmD, BCPS Clinical Pharmacist Pager: [phone] 08/28/2014 9:28 AM

## 2014-08-28 NOTE — Progress Notes (Signed)
UR completed. Await medical stabilization and following acute therapy evals to determine pt's needs for discharge.   Sandi Mariscal, RN BSN [place] CCM Trauma/Neuro ICU Case Manager [phone]

## 2014-08-28 NOTE — Progress Notes (Signed)
Patient ID: Holly [name], female   DOB: 1935/12/04, 79 y.o.   MRN: [id number]    Subjective:  Denies SSCP, palpitations or Dyspnea Nausea/Dizzyness better She has no idea she had a stroke Needs further education by neuro  Objective:  Filed Vitals:   08/28/14 0759 08/28/14 0800 08/28/14 0900 08/28/14 1000  BP:  139/72 138/66 138/66  Pulse:  81 77 81  Temp: 98.5 F (36.9 C)     TempSrc: Oral     Resp:  19 16 14   Height:      Weight:      SpO2:  97% 95% 96%    Intake/Output from previous day:  Intake/Output Summary (Last 24 hours) at 08/28/14 1028 Last data filed at 08/28/14 1000  Gross per 24 hour  Intake 190.02 ml  Output   2175 ml  Net -1984.98 ml    Physical Exam: Affect appropriate Healthy:  appears stated age HEENT: normal Neck supple with no adenopathy JVP normal no bruits no thyromegaly Lungs clear with no wheezing and good diaphragmatic motion Heart:  S1/S2 no murmur, no rub, gallop or click PMI normal Abdomen: benighn, BS positve, no tenderness, no AAA no bruit.  No HSM or HJR Distal pulses intact with no bruits No edema Neuro non-focal Skin warm and dry No muscular weakness   Lab Results: Basic Metabolic Panel:  Recent Labs  08/27/14 1020  NA 139  K 3.5  CL 107  CO2 21*  GLUCOSE 265*  BUN 16  CREATININE 0.78  CALCIUM 9.0  MG 1.7   Liver Function Tests:  Recent Labs  08/27/14 1020  AST 19  ALT 14  ALKPHOS 54  BILITOT 1.0  PROT 6.6  ALBUMIN 3.9    Recent Labs  08/27/14 1020  LIPASE 33   CBC:  Recent Labs  08/27/14 1020 08/28/14 0739  WBC 15.7* 14.3*  NEUTROABS 12.6*  --   HGB 13.2 12.8  HCT 39.2 38.3  MCV 88.9 88.9  PLT 226 194   Cardiac Enzymes:  Recent Labs  08/27/14 1850 08/27/14 2327 08/28/14 0739  TROPONINI 0.32* 0.50* 0.47*   Fasting Lipid Panel:  Recent Labs  08/28/14 0739  CHOL 232*  HDL 54  LDLCALC 159*  TRIG 97  CHOLHDL 4.3   Thyroid Function Tests: No results for input(s): TSH,  T4TOTAL, T3FREE, THYROIDAB in the last 72 hours.  Invalid input(s): FREET3 Anemia Panel: No results for input(s): VITAMINB12, FOLATE, FERRITIN, TIBC, IRON, RETICCTPCT in the last 72 hours.  Imaging: Imaging results have been reviewed and Ct Angio Head W/cm &/or Wo Cm  08/27/2014   ADDENDUM REPORT: 08/27/2014 14:20  ADDENDUM: Enlarged, nodular thyroid gland. Consider further evaluation with ultrasound.   Electronically Signed   By: Logan Bores   On: 08/27/2014 14:20   08/27/2014   CLINICAL DATA:  Dizziness, gait instability, nausea, vomiting, and left-sided weakness upon awakening this morning.  EXAM: CT ANGIOGRAPHY HEAD AND NECK  TECHNIQUE: Multidetector CT imaging of the head and neck was performed using the standard protocol during bolus administration of intravenous contrast. Multiplanar CT image reconstructions and MIPs were obtained to evaluate the vascular anatomy. Carotid stenosis measurements (when applicable) are obtained utilizing NASCET criteria, using the distal internal carotid diameter as the denominator.  CONTRAST:  52mL OMNIPAQUE IOHEXOL 350 MG/ML SOLN  COMPARISON:  None  FINDINGS: CT HEAD  Brain: There is a subcentimeter, well-defined focus of hypodensity in the posterior right cerebellar hemisphere most compatible with a chronic infarct. There is no  evidence of acute cortical infarct, intracranial hemorrhage, mass, midline shift, or extra-axial fluid collection. Ventricles and sulci are within normal limits for age.  Calvarium and skull base: No destructive skull lesions.  Paranasal sinuses: Visualized paranasal sinuses are clear. Trace effusions at the mastoid tips.  Orbits: Unremarkable.  CTA NECK  Aortic arch: Normal variant aortic arch branching pattern with common origin of the brachiocephalic and left common carotid arteries. Mild-to-moderate atherosclerotic calcification of the aortic arch and proximal arch vessels. No significant brachiocephalic or right subclavian artery stenosis.  Prominent calcification in the proximal left subclavian artery results in up to 50% stenosis.  Right carotid system: Common carotid artery is patent without stenosis. There is mild atherosclerotic calcification in the proximal ICA without stenosis.  Left carotid system: Common carotid artery is patent with a small amount of noncalcified plaque proximally without associated stenosis. Moderate calcification involving the carotid bifurcation and proximal ICA does not result in significant stenosis.  Vertebral arteries: The right vertebral artery is patent and dominant without stenosis. The left vertebral artery is patent proximally but demonstrates progressively diminishing opacification and caliber throughout the V2 segment and becomes completely occluded at C2.  Skeleton: There is slight anterolisthesis of C4 on C5, likely facet mediated. Moderate disc space narrowing is present C5-6. There is moderate, there is asymmetric, severe left facet arthrosis at C4-5 with mild-to-moderate facet arthrosis at C2-3 and C3 for.  Other neck: The thyroid gland is diffusely heterogeneous with nodule enlargement of the left greater than right lobes, possibly with a 2 cm nodule in the left lobe.  CTA HEAD  Anterior circulation: Internal carotid arteries are patent from skullbase to carotid termini. There is moderate carotid siphon calcification bilaterally without significant stenosis. ACAs and MCAs are unremarkable aside from mild distal branch vessel irregularity. No intracranial aneurysm is identified.  Posterior circulation: The intracranial right vertebral artery is patent without stenosis and supplies the basilar. There is a small amount of retrograde opacification of the distal left vertebral artery. Right PICA may share a common origin with the right AICA. Left PICA appears occluded. SCA origins are patent. Basilar artery is patent without stenosis. PCAs are patent with mild irregularity bilaterally but no significant  proximal stenosis. There may be tiny bilateral posterior communicating arteries.  Venous sinuses: Patent.  Anatomic variants: None.  Delayed phase: No abnormal enhancement.  IMPRESSION: 1. 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 Acute occlusion, including from a dissection, is possible. 2. Patent and dominant right vertebral artery. 3. No cervical carotid artery stenosis. 4. Intracranial atherosclerosis without significant proximal stenosis. No major intracranial arterial occlusion. 5. 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  These results were called by telephone at the time of interpretation on 08/27/2014 at 1:25 pm to Dr. Doy Mince, who verbally acknowledged these results.  Electronically Signed: By: Logan Bores On: 08/27/2014 13:31   Ct Angio Neck W/cm &/or Wo/cm  08/27/2014   ADDENDUM REPORT: 08/27/2014 14:20  ADDENDUM: Enlarged, nodular thyroid gland. Consider further evaluation with ultrasound.   Electronically Signed   By: Logan Bores   On: 08/27/2014 14:20   08/27/2014   CLINICAL DATA:  Dizziness, gait instability, nausea, vomiting, and left-sided weakness upon awakening this morning.  EXAM: CT ANGIOGRAPHY HEAD AND NECK  TECHNIQUE: Multidetector CT imaging of the head and neck was performed using the standard protocol during bolus administration of intravenous contrast. Multiplanar CT image reconstructions and MIPs were obtained to evaluate the vascular anatomy. Carotid stenosis measurements (when applicable)  are obtained utilizing NASCET criteria, using the distal internal carotid diameter as the denominator.  CONTRAST:  26mL OMNIPAQUE IOHEXOL 350 MG/ML SOLN  COMPARISON:  None  FINDINGS: CT HEAD  Brain: There is a subcentimeter, well-defined focus of hypodensity in the posterior right cerebellar hemisphere most compatible with a chronic infarct. There is no evidence of acute cortical infarct, intracranial hemorrhage, mass, midline shift, or extra-axial fluid collection. Ventricles  and sulci are within normal limits for age.  Calvarium and skull base: No destructive skull lesions.  Paranasal sinuses: Visualized paranasal sinuses are clear. Trace effusions at the mastoid tips.  Orbits: Unremarkable.  CTA NECK  Aortic arch: Normal variant aortic arch branching pattern with common origin of the brachiocephalic and left common carotid arteries. Mild-to-moderate atherosclerotic calcification of the aortic arch and proximal arch vessels. No significant brachiocephalic or right subclavian artery stenosis. Prominent calcification in the proximal left subclavian artery results in up to 50% stenosis.  Right carotid system: Common carotid artery is patent without stenosis. There is mild atherosclerotic calcification in the proximal ICA without stenosis.  Left carotid system: Common carotid artery is patent with a small amount of noncalcified plaque proximally without associated stenosis. Moderate calcification involving the carotid bifurcation and proximal ICA does not result in significant stenosis.  Vertebral arteries: The right vertebral artery is patent and dominant without stenosis. The left vertebral artery is patent proximally but demonstrates progressively diminishing opacification and caliber throughout the V2 segment and becomes completely occluded at C2.  Skeleton: There is slight anterolisthesis of C4 on C5, likely facet mediated. Moderate disc space narrowing is present C5-6. There is moderate, there is asymmetric, severe left facet arthrosis at C4-5 with mild-to-moderate facet arthrosis at C2-3 and C3 for.  Other neck: The thyroid gland is diffusely heterogeneous with nodule enlargement of the left greater than right lobes, possibly with a 2 cm nodule in the left lobe.  CTA HEAD  Anterior circulation: Internal carotid arteries are patent from skullbase to carotid termini. There is moderate carotid siphon calcification bilaterally without significant stenosis. ACAs and MCAs are unremarkable  aside from mild distal branch vessel irregularity. No intracranial aneurysm is identified.  Posterior circulation: The intracranial right vertebral artery is patent without stenosis and supplies the basilar. There is a small amount of retrograde opacification of the distal left vertebral artery. Right PICA may share a common origin with the right AICA. Left PICA appears occluded. SCA origins are patent. Basilar artery is patent without stenosis. PCAs are patent with mild irregularity bilaterally but no significant proximal stenosis. There may be tiny bilateral posterior communicating arteries.  Venous sinuses: Patent.  Anatomic variants: None.  Delayed phase: No abnormal enhancement.  IMPRESSION: 1. 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 Acute occlusion, including from a dissection, is possible. 2. Patent and dominant right vertebral artery. 3. No cervical carotid artery stenosis. 4. Intracranial atherosclerosis without significant proximal stenosis. No major intracranial arterial occlusion. 5. 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  These results were called by telephone at the time of interpretation on 08/27/2014 at 1:25 pm to Dr. Doy Mince, who verbally acknowledged these results.  Electronically Signed: By: Logan Bores On: 08/27/2014 13:31   Mr Brain Wo Contrast  08/28/2014   CLINICAL DATA:  Awoke with vertigo, nausea, vomiting and LEFT-sided weakness, LEFT neck pain. Known LEFT vertebral artery occlusive dissection. History of hypertension and diabetes.  EXAM: MRI HEAD WITHOUT CONTRAST  TECHNIQUE: Multiplanar, multiecho pulse sequences of the brain and surrounding structures  were obtained without intravenous contrast.  COMPARISON:  CT angiogram of the head and neck Aug 27, 2014  FINDINGS: Wedge-like area of reduced diffusion within LEFT inferior cerebellum, corresponding low ADC values and mildly bright T2 signal. Susceptibility artifact about the face somewhat limits evaluation. No  susceptibility artifact to suggest hemorrhage.  Remote small RIGHT cerebellar infarct. Ventricles and sulci are normal for patient's age. A few scattered subcentimeter white matter changes suggest chronic small vessel ischemic disease, less than expected for age. No midline shift, mass effect or mass lesions.  No abnormal extra-axial fluid collections. T2 bright signal within LEFT V4 segment consistent with known dissection and occlusion/thrombosis. Ocular globes and orbital contents are unremarkable. Small maxillary mucosal retention cyst without paranasal sinus air-fluid levels. The mastoid air cells are well aerated. No abnormal sellar expansion. No cerebellar tonsillar ectopia. No suspicious calvarial bone marrow signal.  IMPRESSION: Acute LEFT posterior inferior cerebellar artery territory infarct assist 2 with LEFT vertebral artery occlusion.  Small remote RIGHT cerebellar infarct. Involutional changes. Minimal white matter changes suggest chronic small vessel ischemic disease.   Electronically Signed   By: Elon Alas   On: 08/28/2014 06:22    Cardiac Studies:  ECG:   NSR no ST elevation first degree block  Telemetry:  NSR rate 80's    Echo:  pending  Medications:   .  stroke: mapping our early stages of recovery book   Does not apply Once  . insulin aspart  0-15 Units Subcutaneous TID WC  . magnesium oxide  400 mg Oral BID     . heparin 1,050 Units/hr (08/28/14 0029)    Assessment/Plan:  Bradycardia:  Resolved due to vagal influence of stroke, nausea and vohmiting  No need for further w/u Troponin:  Minimal elevation no chest pain ECG normal now.  Sees Parachos in [place] outpatient f/u with him PICA Stroke:  Per primary service PT/OT ? Antiplatelet Rx First Degree Block:  Stable no high grade AV block no indication for monitoring or pacer   Will sign off   Jenkins Rouge 08/28/2014, 10:28 AM

## 2014-08-28 NOTE — Progress Notes (Signed)
ANTICOAGULATION CONSULT NOTE - Follow up  Pharmacy Consult for Heparin Indication: Vertebral dissection  No Known Allergies  Patient Measurements: Height: 5\' 7"  (170.2 cm) Weight: 158 lb 4.6 oz (71.8 kg) (change in bed) IBW/kg (Calculated) : 61.6  Vital Signs: Temp: 99 F (37.2 C) (05/05 2339) Temp Source: Oral (05/05 2339) BP: 137/78 mmHg (05/05 2100) Pulse Rate: 72 (05/05 2100)  Labs:  Recent Labs  08/27/14 1020 08/27/14 1850 08/27/14 2158  HGB 13.2  --   --   HCT 39.2  --   --   PLT 226  --   --   LABPROT 13.8  --   --   INR 1.05  --   --   HEPARINUNFRC  --   --  0.27*  CREATININE 0.78  --   --   TROPONINI <0.03 0.32*  --     Estimated Creatinine Clearance: 57.3 mL/min (by C-G formula based on Cr of 0.78).   Medical History: Past Medical History  Diagnosis Date  . Vertigo   . Hypertension   . Diabetes mellitus without complication     non insulin dependent    Medications:  Prescriptions prior to admission  Medication Sig Dispense Refill Last Dose  . Coenzyme Q10 (CO Q 10) 10 MG CAPS Take 10 mg by mouth daily.   08/26/2014 at Unknown time  . escitalopram (LEXAPRO) 10 MG tablet Take 10 mg by mouth daily.   08/26/2014 at Unknown time  . losartan (COZAAR) 25 MG tablet Take 25 mg by mouth daily.   08/26/2014 at Unknown time  . metFORMIN (GLUCOPHAGE) 500 MG tablet Take 500 mg by mouth 2 (two) times daily with a meal.   08/26/2014 at Unknown time  . Red Yeast Rice 600 MG TABS Take 600 mg by mouth daily.   08/26/2014 at Unknown time    Assessment: 8 hour heparin level is 0.27 on IV heparin drip 950 units/hr for vertebral dissection in this 79 yo F.  RN reports no problems with IV heparin infusion and no bleeding noted.   Baseline/admit lab SCr wnl at 0.78 (CrCl ~57 ml/min), CBC wnl.  Goal of Therapy:  Heparin level 0.3-0.7 units/ml Monitor platelets by anticoagulation protocol: Yes   Plan:  Increase heparin gtt to 1050 units/hr without bolus (~12-13 u/kg/hr) -  F/u 6  hour HL - Daily HL/CBC - Monitor for s/s bleeding  Nicole Cella, RPh Clinical Pharmacist Pager: [phone] 08/28/2014 12:22 AM

## 2014-08-28 NOTE — Evaluation (Signed)
Speech Language Pathology Evaluation Patient Details Name: Holly [name] MRN: [id number] DOB: Jul 09, 1935 Today's Date: 08/28/2014 Time: 0200-0230 SLP Time Calculation (min) (ACUTE ONLY): 30 min  Problem List:  Patient Active Problem List   Diagnosis Date Noted  . First degree AV block 08/28/2014  . Bradycardia 08/28/2014  . 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 Third degree AV block   . Essential hypertension    Past Medical History:  Past Medical History  Diagnosis Date  . Vertigo   . Hypertension   . Diabetes mellitus without complication     non insulin dependent   Past Surgical History: History reviewed. No pertinent past surgical history. HPI:  Pt is a 79 yo female admitted on 08/27/14 with severe vertigo and vomiting. She also identified some left-sided neurologic symptoms. Physical exam did not identify motor deficit however subjectively there was diminished sensation. CTA on 08/27/14 identified a vertebral basilar or dissection/occlusion.  MRI pending. Pt has a pmhx significant for NIDDM and Bell's Palsy affecting right facial region.  Swallow screen passed with no difficulties indicated by pt.   Assessment / Plan / Recommendation Clinical Impression   Pt exhibited speech/language/cognition that was within functional limits with all tasks including verbal expression and auditory comprehension tasks, as well as cognitive tasks during assessment.  Pt exhibited insight into dx and limitations upon discharge with caring for her husband who has medical issues as she was the primary caregiver prior to this hospitalization.  Residual right-sided weakness noted due to Bell's palsy hx, but functional oral motor skills for articulation/swallowing purposes observed.     SLP Assessment  Patient does not need any further Speech Language Pathology Services    Follow Up Recommendations  None    Frequency and Duration   n/a     Pertinent Vitals/Pain Pain Assessment: No/denies pain    SLP Goals   n/a  SLP Evaluation Prior Functioning  Cognitive/Linguistic Baseline: Within functional limits Type of Home: House  Lives With: Spouse Available Help at Discharge: Family;Friend(s);Available PRN/intermittently Education: High school with some college courses Vocation: Retired   Associate Professor  Overall Cognitive Status: Within Functional Limits for tasks assessed Arousal/Alertness: Awake/alert Orientation Level: Oriented X4 Memory: Appears intact Awareness: Appears intact Problem Solving: Appears intact Safety/Judgment: Appears intact    Comprehension  Auditory Comprehension Overall Auditory Comprehension: Appears within functional limits for tasks assessed Yes/No Questions: Within Functional Limits Commands: Within Functional Limits Conversation: Complex Visual Recognition/Discrimination Discrimination: Not tested Reading Comprehension Reading Status: Unable to assess (comment) (intermittent vertigo symptoms)    Expression Expression Primary Mode of Expression: Verbal Verbal Expression Overall Verbal Expression: Appears within functional limits for tasks assessed Initiation: No impairment Level of Generative/Spontaneous Verbalization: Conversation Repetition: No impairment Naming: No impairment Pragmatics: No impairment Non-Verbal Means of Communication: Not applicable   Oral / Motor Oral Motor/Sensory Function Overall Oral Motor/Sensory Function: Impaired at baseline Labial ROM: Reduced right Labial Symmetry: Abnormal symmetry right Labial Strength: Within Functional Limits Labial Sensation: Within Functional Limits Lingual ROM: Within Functional Limits Lingual Symmetry: Within Functional Limits Lingual Strength: Within Functional Limits Lingual Sensation: Within Functional Limits Facial ROM: Reduced right Facial Symmetry: Right droop Facial Strength: Within Functional Limits Facial Sensation: Within Functional Limits Velum: Within Functional  Limits Mandible: Within Functional Limits Motor Speech Overall Motor Speech: Appears within functional limits for tasks assessed Respiration: Within functional limits Phonation: Normal Resonance: Within functional limits Articulation: Within functional limitis Intelligibility: Intelligible Motor Planning: Witnin functional limits Motor Speech Errors: Not applicable  Ambur Province,PAT, M.S., CCC-SLP 08/28/2014, 3:05 PM

## 2014-08-28 NOTE — Evaluation (Signed)
Physical Therapy Evaluation Patient Details Name: ELLIETTE SEABOLT MRN: [id number] DOB: Jun 19, 1935 Today's Date: 08/28/2014   History of Present Illness  pt is a 79 y/o female  having experienced episodes of vertigo in the past that was admitted with nausea/vomiting and s/s of vertigo with spinning.  MRI shows small cerebellar infarct on Left  Clinical Impression  Pt admitted with/for dizziness and nausea due to L cerebellar infarct.  Pt currently limited functionally due to the problems listed. ( See problems list.)   Pt will benefit from PT to maximize function and safety in order to get ready for next venue listed below.     Follow Up Recommendations CIR    Equipment Recommendations  Other (comment) (TBA)    Recommendations for Other Services Rehab consult     Precautions / Restrictions Precautions Precautions: Fall      Mobility  Bed Mobility Overal bed mobility: Needs Assistance Bed Mobility: Supine to Sit     Supine to sit: Min assist     General bed mobility comments: Constant cuing for compensatory strategies, but pt unable to focus due to anxious with modesty  Transfers Overall transfer level: Needs assistance   Transfers: Sit to/from Stand;Stand Pivot Transfers Sit to Stand: Min assist Stand pivot transfers: Mod assist       General transfer comment: became nauseated and dizzy with movement.  Could not follow the compensation due to anxiety.  Ambulation/Gait             General Gait Details: Unable due to dizziness and nausea  Stairs            Wheelchair Mobility    Modified Rankin (Stroke Patients Only) Modified Rankin (Stroke Patients Only) Pre-Morbid Rankin Score: No symptoms Modified Rankin: Moderately severe disability     Balance Overall balance assessment: Needs assistance Sitting-balance support: No upper extremity supported Sitting balance-Leahy Scale: Fair     Standing balance support: Single extremity supported Standing  balance-Leahy Scale: Poor                               Pertinent Vitals/Pain Pain Assessment: No/denies pain    Home Living Family/patient expects to be discharged to:: Private residence Living Arrangements: Spouse/significant other Available Help at Discharge: Family;Friend(s);Available PRN/intermittently Type of Home: House Home Access: Stairs to enter Entrance Stairs-Rails: Psychiatric nurse of Steps: 3   Home Equipment: Shower seat (pt doesn't have equipment for herself if needed)      Prior Function Level of Independence: Independent         Comments: pt has to keep the household going and help her husband     Hand Dominance        Extremity/Trunk Assessment   Upper Extremity Assessment: Defer to OT evaluation           Lower Extremity Assessment: Overall WFL for tasks assessed;LLE deficits/detail   LLE Deficits / Details: mild weakness/coordination issues     Communication   Communication: No difficulties  Cognition Arousal/Alertness: Awake/alert Behavior During Therapy: WFL for tasks assessed/performed Overall Cognitive Status: Within Functional Limits for tasks assessed                      General Comments General comments (skin integrity, edema, etc.): vitals stayed stable    Exercises        Assessment/Plan    PT Assessment Patient needs continued PT services  PT Diagnosis  Difficulty walking;Other (comment) (vertigo)   PT Problem List Decreased strength;Decreased activity tolerance;Decreased balance;Decreased mobility;Decreased knowledge of use of DME  PT Treatment Interventions DME instruction;Gait training;Stair training;Functional mobility training;Therapeutic activities;Balance training;Patient/family education   PT Goals (Current goals can be found in the Care Plan section) Acute Rehab PT Goals Patient Stated Goal: home, independent, and able to help my husband PT Goal Formulation: With  patient Time For Goal Achievement: 09/11/14 Potential to Achieve Goals: Good    Frequency Min 3X/week   Barriers to discharge        Co-evaluation               End of Session   Activity Tolerance: Other (comment) (limited by dizziness and nausea) Patient left: in chair;with call bell/phone within reach Nurse Communication: Mobility status         Time: 7096-2836 PT Time Calculation (min) (ACUTE ONLY): 22 min   Charges:   PT Evaluation $Initial PT Evaluation Tier I: 1 Procedure     PT G Codes:        Yancy Hascall, Tessie Fass 08/28/2014, 4:54 PM  08/28/2014  Donnella Sham, PT [phone]  (pager)

## 2014-08-28 NOTE — Progress Notes (Addendum)
Stroke Team Progress Note  HISTORY Holly [name] is an 79 y.o. female hx of HTN, DM presenting after waking up with symptoms of dizziness (described as vertigo), gait instability, nausea/vomitting and left sided weakness. Reports LSW midnight 5/04 when she went to the bathroom with no difficulty. Woke up this morning and noted symptoms as soon as waking up.  Of note, patient had severe bradycardic episode in the ED while having a bout of emesis.  Also notes pain on the left side of her lateral/posterior cervical neck region. Reports a multi-year history of similar (though less severe) vertigo and nausea anytime she lays flat and/or extends her neck back.  Date last known well: 08/26/2014 Time last known well: midnight tPA Given: no, outside of IV tPA window  . She was admitted to the neuro ICU  for further evaluation and treatment.  SUBJECTIVE Her RN is at the bedside.  Overall she feels her condition is gradually improving. She still complains of dizziness though her vertigo is improving. Nausea is better and left-sided weakness has improved. Her blood pressure has been adequately controlled. MRI scan of the brain has been obtained and it shows a left posterior inferior cerebellar artery infarct. The patient denies any history of neck pain,injury, trauma or fall  OBJECTIVE Most recent Vital Signs: Filed Vitals:   08/28/14 1146 08/28/14 1200 08/28/14 1300 08/28/14 1400  BP:  140/60 151/63 147/55  Pulse:  78 45 45  Temp: 98.2 F (36.8 C)     TempSrc: Oral     Resp:  18 16 16   Height:      Weight:      SpO2:  97% 99% 96%   CBG (last 3)   Recent Labs  08/27/14 2351 08/28/14 0757 08/28/14 1145  GLUCAP 150* 145* 251*    IV Fluid Intake:   . heparin 1,050 Units/hr (08/28/14 0029)    MEDICATIONS  .  stroke: mapping our early stages of recovery book   Does not apply Once  . insulin aspart  0-15 Units Subcutaneous TID WC  . magnesium oxide  400 mg Oral BID   PRN:   senna-docusate  Diet:  Diet heart healthy/carb modified Room service appropriate?: Yes; Fluid consistency:: Thin   liquids Activity:  Bedrest  DVT Prophylaxis:  Iv heparin CLINICALLY SIGNIFICANT STUDIES Basic Metabolic Panel:   Recent Labs Lab 08/27/14 1020  NA 139  K 3.5  CL 107  CO2 21*  GLUCOSE 265*  BUN 16  CREATININE 0.78  CALCIUM 9.0  MG 1.7   Liver Function Tests:   Recent Labs Lab 08/27/14 1020  AST 19  ALT 14  ALKPHOS 54  BILITOT 1.0  PROT 6.6  ALBUMIN 3.9   CBC:   Recent Labs Lab 08/27/14 1020 08/28/14 0739  WBC 15.7* 14.3*  NEUTROABS 12.6*  --   HGB 13.2 12.8  HCT 39.2 38.3  MCV 88.9 88.9  PLT 226 194   Coagulation:   Recent Labs Lab 08/27/14 1020  LABPROT 13.8  INR 1.05   Cardiac Enzymes:   Recent Labs Lab 08/27/14 1850 08/27/14 2327 08/28/14 0739  TROPONINI 0.32* 0.50* 0.47*   Urinalysis:   Recent Labs Lab 08/27/14 1253  COLORURINE YELLOW  LABSPEC 1.026  PHURINE 6.5  GLUCOSEU >1000*  HGBUR NEGATIVE  BILIRUBINUR NEGATIVE  KETONESUR 40*  PROTEINUR NEGATIVE  UROBILINOGEN 0.2  NITRITE NEGATIVE  LEUKOCYTESUR NEGATIVE   Lipid Panel    Component Value Date/Time   CHOL 232* 08/28/2014 0739   TRIG  97 08/28/2014 0739   HDL 54 08/28/2014 0739   CHOLHDL 4.3 08/28/2014 0739   VLDL 19 08/28/2014 0739   LDLCALC 159* 08/28/2014 0739   HgbA1C No results found for: HGBA1C  Urine Drug Screen:  No results found for: LABOPIA, COCAINSCRNUR, LABBENZ, AMPHETMU, THCU, LABBARB  Alcohol Level: No results for input(s): ETH in the last 168 hours.  Ct Angio Head W/cm &/or Wo Cm  08/27/2014   ADDENDUM REPORT: 08/27/2014 14:20  ADDENDUM: Enlarged, nodular thyroid gland. Consider further evaluation with ultrasound.   Electronically Signed   By: Logan Bores   On: 08/27/2014 14:20   08/27/2014   CLINICAL DATA:  Dizziness, gait instability, nausea, vomiting, and left-sided weakness upon awakening this morning.  EXAM: CT ANGIOGRAPHY HEAD AND  NECK  TECHNIQUE: Multidetector CT imaging of the head and neck was performed using the standard protocol during bolus administration of intravenous contrast. Multiplanar CT image reconstructions and MIPs were obtained to evaluate the vascular anatomy. Carotid stenosis measurements (when applicable) are obtained utilizing NASCET criteria, using the distal internal carotid diameter as the denominator.  CONTRAST:  61mL OMNIPAQUE IOHEXOL 350 MG/ML SOLN  COMPARISON:  None  FINDINGS: CT HEAD  Brain: There is a subcentimeter, well-defined focus of hypodensity in the posterior right cerebellar hemisphere most compatible with a chronic infarct. There is no evidence of acute cortical infarct, intracranial hemorrhage, mass, midline shift, or extra-axial fluid collection. Ventricles and sulci are within normal limits for age.  Calvarium and skull base: No destructive skull lesions.  Paranasal sinuses: Visualized paranasal sinuses are clear. Trace effusions at the mastoid tips.  Orbits: Unremarkable.  CTA NECK  Aortic arch: Normal variant aortic arch branching pattern with common origin of the brachiocephalic and left common carotid arteries. Mild-to-moderate atherosclerotic calcification of the aortic arch and proximal arch vessels. No significant brachiocephalic or right subclavian artery stenosis. Prominent calcification in the proximal left subclavian artery results in up to 50% stenosis.  Right carotid system: Common carotid artery is patent without stenosis. There is mild atherosclerotic calcification in the proximal ICA without stenosis.  Left carotid system: Common carotid artery is patent with a small amount of noncalcified plaque proximally without associated stenosis. Moderate calcification involving the carotid bifurcation and proximal ICA does not result in significant stenosis.  Vertebral arteries: The right vertebral artery is patent and dominant without stenosis. The left vertebral artery is patent proximally but  demonstrates progressively diminishing opacification and caliber throughout the V2 segment and becomes completely occluded at C2.  Skeleton: There is slight anterolisthesis of C4 on C5, likely facet mediated. Moderate disc space narrowing is present C5-6. There is moderate, there is asymmetric, severe left facet arthrosis at C4-5 with mild-to-moderate facet arthrosis at C2-3 and C3 for.  Other neck: The thyroid gland is diffusely heterogeneous with nodule enlargement of the left greater than right lobes, possibly with a 2 cm nodule in the left lobe.  CTA HEAD  Anterior circulation: Internal carotid arteries are patent from skullbase to carotid termini. There is moderate carotid siphon calcification bilaterally without significant stenosis. ACAs and MCAs are unremarkable aside from mild distal branch vessel irregularity. No intracranial aneurysm is identified.  Posterior circulation: The intracranial right vertebral artery is patent without stenosis and supplies the basilar. There is a small amount of retrograde opacification of the distal left vertebral artery. Right PICA may share a common origin with the right AICA. Left PICA appears occluded. SCA origins are patent. Basilar artery is patent without stenosis. PCAs are patent  with mild irregularity bilaterally but no significant proximal stenosis. There may be tiny bilateral posterior communicating arteries.  Venous sinuses: Patent.  Anatomic variants: None.  Delayed phase: No abnormal enhancement.  IMPRESSION: 1. 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 Acute occlusion, including from a dissection, is possible. 2. Patent and dominant right vertebral artery. 3. No cervical carotid artery stenosis. 4. Intracranial atherosclerosis without significant proximal stenosis. No major intracranial arterial occlusion. 5. 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  These results were called by telephone at the time of interpretation on 08/27/2014 at 1:25 pm  to Dr. Doy Mince, who verbally acknowledged these results.  Electronically Signed: By: Logan Bores On: 08/27/2014 13:31   Ct Angio Neck W/cm &/or Wo/cm  08/27/2014   ADDENDUM REPORT: 08/27/2014 14:20  ADDENDUM: Enlarged, nodular thyroid gland. Consider further evaluation with ultrasound.   Electronically Signed   By: Logan Bores   On: 08/27/2014 14:20   08/27/2014   CLINICAL DATA:  Dizziness, gait instability, nausea, vomiting, and left-sided weakness upon awakening this morning.  EXAM: CT ANGIOGRAPHY HEAD AND NECK  TECHNIQUE: Multidetector CT imaging of the head and neck was performed using the standard protocol during bolus administration of intravenous contrast. Multiplanar CT image reconstructions and MIPs were obtained to evaluate the vascular anatomy. Carotid stenosis measurements (when applicable) are obtained utilizing NASCET criteria, using the distal internal carotid diameter as the denominator.  CONTRAST:  44mL OMNIPAQUE IOHEXOL 350 MG/ML SOLN  COMPARISON:  None  FINDINGS: CT HEAD  Brain: There is a subcentimeter, well-defined focus of hypodensity in the posterior right cerebellar hemisphere most compatible with a chronic infarct. There is no evidence of acute cortical infarct, intracranial hemorrhage, mass, midline shift, or extra-axial fluid collection. Ventricles and sulci are within normal limits for age.  Calvarium and skull base: No destructive skull lesions.  Paranasal sinuses: Visualized paranasal sinuses are clear. Trace effusions at the mastoid tips.  Orbits: Unremarkable.  CTA NECK  Aortic arch: Normal variant aortic arch branching pattern with common origin of the brachiocephalic and left common carotid arteries. Mild-to-moderate atherosclerotic calcification of the aortic arch and proximal arch vessels. No significant brachiocephalic or right subclavian artery stenosis. Prominent calcification in the proximal left subclavian artery results in up to 50% stenosis.  Right carotid system:  Common carotid artery is patent without stenosis. There is mild atherosclerotic calcification in the proximal ICA without stenosis.  Left carotid system: Common carotid artery is patent with a small amount of noncalcified plaque proximally without associated stenosis. Moderate calcification involving the carotid bifurcation and proximal ICA does not result in significant stenosis.  Vertebral arteries: The right vertebral artery is patent and dominant without stenosis. The left vertebral artery is patent proximally but demonstrates progressively diminishing opacification and caliber throughout the V2 segment and becomes completely occluded at C2.  Skeleton: There is slight anterolisthesis of C4 on C5, likely facet mediated. Moderate disc space narrowing is present C5-6. There is moderate, there is asymmetric, severe left facet arthrosis at C4-5 with mild-to-moderate facet arthrosis at C2-3 and C3 for.  Other neck: The thyroid gland is diffusely heterogeneous with nodule enlargement of the left greater than right lobes, possibly with a 2 cm nodule in the left lobe.  CTA HEAD  Anterior circulation: Internal carotid arteries are patent from skullbase to carotid termini. There is moderate carotid siphon calcification bilaterally without significant stenosis. ACAs and MCAs are unremarkable aside from mild distal branch vessel irregularity. No intracranial aneurysm is identified.  Posterior  circulation: The intracranial right vertebral artery is patent without stenosis and supplies the basilar. There is a small amount of retrograde opacification of the distal left vertebral artery. Right PICA may share a common origin with the right AICA. Left PICA appears occluded. SCA origins are patent. Basilar artery is patent without stenosis. PCAs are patent with mild irregularity bilaterally but no significant proximal stenosis. There may be tiny bilateral posterior communicating arteries.  Venous sinuses: Patent.  Anatomic  variants: None.  Delayed phase: No abnormal enhancement.  IMPRESSION: 1. 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 Acute occlusion, including from a dissection, is possible. 2. Patent and dominant right vertebral artery. 3. No cervical carotid artery stenosis. 4. Intracranial atherosclerosis without significant proximal stenosis. No major intracranial arterial occlusion. 5. 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  These results were called by telephone at the time of interpretation on 08/27/2014 at 1:25 pm to Dr. Doy Mince, who verbally acknowledged these results.  Electronically Signed: By: Logan Bores On: 08/27/2014 13:31   Mr Brain Wo Contrast  08/28/2014   CLINICAL DATA:  Awoke with vertigo, nausea, vomiting and LEFT-sided weakness, LEFT neck pain. Known LEFT vertebral artery occlusive dissection. History of hypertension and diabetes.  EXAM: MRI HEAD WITHOUT CONTRAST  TECHNIQUE: Multiplanar, multiecho pulse sequences of the brain and surrounding structures were obtained without intravenous contrast.  COMPARISON:  CT angiogram of the head and neck Aug 27, 2014  FINDINGS: Wedge-like area of reduced diffusion within LEFT inferior cerebellum, corresponding low ADC values and mildly bright T2 signal. Susceptibility artifact about the face somewhat limits evaluation. No susceptibility artifact to suggest hemorrhage.  Remote small RIGHT cerebellar infarct. Ventricles and sulci are normal for patient's age. A few scattered subcentimeter white matter changes suggest chronic small vessel ischemic disease, less than expected for age. No midline shift, mass effect or mass lesions.  No abnormal extra-axial fluid collections. T2 bright signal within LEFT V4 segment consistent with known dissection and occlusion/thrombosis. Ocular globes and orbital contents are unremarkable. Small maxillary mucosal retention cyst without paranasal sinus air-fluid levels. The mastoid air cells are well aerated. No abnormal  sellar expansion. No cerebellar tonsillar ectopia. No suspicious calvarial bone marrow signal.  IMPRESSION: Acute LEFT posterior inferior cerebellar artery territory infarct assist 2 with LEFT vertebral artery occlusion.  Small remote RIGHT cerebellar infarct. Involutional changes. Minimal white matter changes suggest chronic small vessel ischemic disease.   Electronically Signed   By: Elon Alas   On: 08/28/2014 06:22      Carotid Doppler  See Ct angio  2D Echocardiogram  Normal LV function; moderate LVH; grade 1 diastolic dysfunction; cannot exclude wall motion abnormality; mild MR. CXR  See CT neck  EKG   Sinus rhythm with 1st degree A-V block Prolonged QT Abnormal ECG   Therapy Recommendations pending  Physical Exam   Frail middle aged lady not in distress. . Afebrile. Head is nontraumatic. Neck is supple without bruit.    Cardiac exam no murmur or gallop. Lungs are clear to auscultation. Distal pulses are well felt. Neurological Exam ;  Awake  Alert oriented x 3. Normal speech and language.eye movements full without nystagmus. Mild saccadic dysmetria to right gaze. Right hemifacial spasms present. 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 fundi were not visualized. Vision acuity and fields appear normal. Hearing is normal. Palatal movements are normal. Face asymmetric. Tongue midline. Normal strength, tone, reflexes and coordination. Normal sensation. Gait  deferred. ASSESSMENT Holly [name] is a 79 y.o. 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 Etiology unclear as to dissection versus atherosclerosis. .  On no antithrombotic prior to admission. Now on heparin for secondary stroke prevention. Patient with resultant  Dizziness and ataxia. Stroke work up underway.    Vertigo    . Hypertension   .  Diabetes mellitus without complication     non insulin dependent        Hospital day # 1  TREATMENT/PLAN I have personally examined this patient, reviewed notes, independently viewed imaging studies, participated in medical decision making and plan of care. I have made any additions or clarifications directly to the above note.  She has presented with dizziness, ataxia, nausea and vomiting second left posterior inferior cerebral artery infarct etiology likely due to left vertebral artery occlusion. She remains at risk for neurological worsening, recurrent stroke, TIA and needs ongoing stroke evaluation and aggressive risk factor control. Start aspirin and Plavix for 3 months and discontinue IV heparin. Mobilize out of bed. Physical occupational therapy consults. Check  , lipid profile and A1c. This patient is critically ill and at significant risk of neurological worsening, death and care requires constant monitoring of vital signs, hemodynamics,respiratory and cardiac monitoring,review of multiple databases, neurological assessment, discussion with family, other specialists and medical decision making of high complexity.I have made any additions or clarifications directly to the above note.  I spent 30 minutes of neurocritical care time  in the care of  this patient.  Antony Contras, MD Medical Director [facility] Pager: [phone] 08/28/2014 4:38 PM         To contact Stroke Continuity provider, please refer to [email or web address] After hours, contact General Neurology

## 2014-08-28 NOTE — Progress Notes (Signed)
CRITICAL VALUE ALERT  Critical value received:  Troponin 0.50  Date of notification:  5/6  Time of notification:  111  Critical value read back:Yes.    Nurse who received alert:  Cecile Sheerer  MD notified (1st page):  Dr. Radford Pax  Time of first page:  50  MD notified (2nd page):  Time of second page:  Responding MD:  Dr. Radford Pax  Time MD responded:  115   Patient already on heparin. No new orders received.

## 2014-08-29 DIAGNOSIS — E785 Hyperlipidemia, unspecified: Secondary | ICD-10-CM

## 2014-08-29 DIAGNOSIS — E1159 Type 2 diabetes mellitus with other circulatory complications: Secondary | ICD-10-CM

## 2014-08-29 DIAGNOSIS — I10 Essential (primary) hypertension: Secondary | ICD-10-CM

## 2014-08-29 DIAGNOSIS — I63012 Cerebral infarction due to thrombosis of left vertebral artery: Secondary | ICD-10-CM

## 2014-08-29 DIAGNOSIS — I6330 Cerebral infarction due to thrombosis of unspecified cerebral artery: Secondary | ICD-10-CM | POA: Diagnosis present

## 2014-08-29 LAB — CBC
HEMATOCRIT: 39.1 % (ref 36.0–46.0)
HEMOGLOBIN: 13 g/dL (ref 12.0–15.0)
MCH: 29.3 pg (ref 26.0–34.0)
MCHC: 33.2 g/dL (ref 30.0–36.0)
MCV: 88.1 fL (ref 78.0–100.0)
Platelets: 217 10*3/uL (ref 150–400)
RBC: 4.44 MIL/uL (ref 3.87–5.11)
RDW: 12.9 % (ref 11.5–15.5)
WBC: 9.2 10*3/uL (ref 4.0–10.5)

## 2014-08-29 LAB — BASIC METABOLIC PANEL
Anion gap: 8 (ref 5–15)
BUN: 18 mg/dL (ref 6–20)
CO2: 26 mmol/L (ref 22–32)
CREATININE: 0.68 mg/dL (ref 0.44–1.00)
Calcium: 8.8 mg/dL — ABNORMAL LOW (ref 8.9–10.3)
Chloride: 103 mmol/L (ref 101–111)
GFR calc Af Amer: >60 mL/min (ref >60)
GLUCOSE: 163 mg/dL — AB (ref 70–99)
POTASSIUM: 3.9 mmol/L (ref 3.5–5.1)
Sodium: 137 mmol/L (ref 135–145)

## 2014-08-29 LAB — GLUCOSE, CAPILLARY
GLUCOSE-CAPILLARY: 162 mg/dL — AB (ref 70–99)
Glucose-Capillary: 176 mg/dL — ABNORMAL HIGH (ref 70–99)
Glucose-Capillary: 140 mg/dL — ABNORMAL HIGH (ref 70–99)

## 2014-08-29 LAB — TROPONIN I: TROPONIN I: 0.24 ng/mL — AB (ref <0.031)

## 2014-08-29 LAB — HEMOGLOBIN A1C
Hgb A1c MFr Bld: 6.8 % — ABNORMAL HIGH (ref 4.8–5.6)
Hgb A1c MFr Bld: 6.8 % — ABNORMAL HIGH (ref 4.8–5.6)
Mean Plasma Glucose: 148 mg/dL
Mean Plasma Glucose: 148 mg/dL

## 2014-08-29 MED ORDER — METFORMIN HCL 500 MG PO TABS
500.0000 mg | ORAL_TABLET | Freq: Two times a day (BID) | ORAL | Status: DC
  Administered 2014-08-30 – 2014-09-01 (×5): 500 mg via ORAL
  Filled 2014-08-29 (×5): qty 1

## 2014-08-29 NOTE — Progress Notes (Signed)
Stroke Team Progress Note  HISTORY Holly [name] is an 79 y.o. female hx of HTN, DM presenting after waking up with symptoms of dizziness (described as vertigo), gait instability, nausea/vomitting and left sided weakness. Reports LSW midnight 5/04 when she went to the bathroom with no difficulty. Woke up this morning and noted symptoms as soon as waking up.  Of note, patient had severe bradycardic episode in the ED while having a bout of emesis.  Also notes pain on the left side of her lateral/posterior cervical neck region. Reports a multi-year history of similar (though less severe) vertigo and nausea anytime she lays flat and/or extends her neck back.  Date last known well: 08/26/2014 Time last known well: midnight tPA Given: no, outside of IV tPA window  SUBJECTIVE Her RN is at the bedside.  Overall she feels her condition is improving. She still complains of mild dizziness but not vertigo. No more N/V. Her blood pressure has been adequately controlled. Pt described her history of dizziness on head turning or head extension, consistent with Bow Hunter syndrome.  OBJECTIVE Most recent Vital Signs: Filed Vitals:   08/28/14 2100 08/28/14 2200 08/28/14 2300 08/29/14 0815  BP: 122/60 122/61 132/60   Pulse: 72 48 75   Temp:    98.6 F (37 C)  TempSrc:    Oral  Resp: 22 13 21    Height:      Weight:      SpO2: 94% 96% 96%    CBG (last 3)   Recent Labs  08/28/14 1741 08/28/14 2010 08/29/14 0814  GLUCAP 134* 229* 176*    IV Fluid Intake:      MEDICATIONS  .  stroke: mapping our early stages of recovery book   Does not apply Once  . aspirin  81 mg Oral Daily  . atorvastatin  40 mg Oral q1800  . clopidogrel  75 mg Oral Daily  . escitalopram  10 mg Oral Daily   PRN:  senna-docusate  Diet:  Diet heart healthy/carb modified Room service appropriate?: Yes; Fluid consistency:: Thin   liquids Activity:  Bedrest  DVT Prophylaxis:  Iv heparin CLINICALLY SIGNIFICANT STUDIES Basic  Metabolic Panel:   Recent Labs Lab 08/27/14 1020 08/29/14 0132  NA 139 137  K 3.5 3.9  CL 107 103  CO2 21* 26  GLUCOSE 265* 163*  BUN 16 18  CREATININE 0.78 0.68  CALCIUM 9.0 8.8*  MG 1.7  --    Liver Function Tests:   Recent Labs Lab 08/27/14 1020  AST 19  ALT 14  ALKPHOS 54  BILITOT 1.0  PROT 6.6  ALBUMIN 3.9   CBC:   Recent Labs Lab 08/27/14 1020 08/28/14 0739 08/29/14 0200  WBC 15.7* 14.3* 9.2  NEUTROABS 12.6*  --   --   HGB 13.2 12.8 13.0  HCT 39.2 38.3 39.1  MCV 88.9 88.9 88.1  PLT 226 194 217   Coagulation:   Recent Labs Lab 08/27/14 1020  LABPROT 13.8  INR 1.05   Cardiac Enzymes:   Recent Labs Lab 08/28/14 0739 08/28/14 1906 08/29/14 0132  TROPONINI 0.47* 0.32* 0.24*   Urinalysis:   Recent Labs Lab 08/27/14 1253  COLORURINE YELLOW  LABSPEC 1.026  PHURINE 6.5  GLUCOSEU >1000*  HGBUR NEGATIVE  BILIRUBINUR NEGATIVE  KETONESUR 40*  PROTEINUR NEGATIVE  UROBILINOGEN 0.2  NITRITE NEGATIVE  LEUKOCYTESUR NEGATIVE   Lipid Panel    Component Value Date/Time   CHOL 232* 08/28/2014 0739   TRIG 97 08/28/2014 0739  HDL 54 08/28/2014 0739   CHOLHDL 4.3 08/28/2014 0739   VLDL 19 08/28/2014 0739   LDLCALC 159* 08/28/2014 0739   HgbA1C  Lab Results  Component Value Date   HGBA1C 6.8* 08/28/2014    Urine Drug Screen:  No results found for: LABOPIA, COCAINSCRNUR, LABBENZ, AMPHETMU, THCU, LABBARB  Alcohol Level: No results for input(s): ETH in the last 168 hours.  I have personally reviewed the radiological images below and agree with the radiology interpretations.  Ct Angio head and Neck W/cm &/or Wo/cm  08/27/2014   IMPRESSION: 1. 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 Acute occlusion, including from a dissection, is possible. 2. Patent and dominant right vertebral artery. 3. No cervical carotid artery stenosis. 4. Intracranial atherosclerosis without significant proximal stenosis. No major intracranial arterial  occlusion. 5. 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   Mr Brain Wo Contrast  08/28/2014  IMPRESSION: Acute LEFT posterior inferior cerebellar artery territory infarct assist 2 with LEFT vertebral artery occlusion.  Small remote RIGHT cerebellar infarct. Involutional changes. Minimal white matter changes suggest chronic small vessel ischemic disease.    2D Echocardiogram  Normal LV function; moderate LVH; grade 1 diastolic dysfunction; cannot exclude wall motion abnormality; mild MR.  EKG   Sinus rhythm with 1st degree A-V block Prolonged QT Abnormal ECG  Physical Exam   Frail middle aged lady not in distress. . Afebrile. Head is nontraumatic. Neck is supple without bruit.    Cardiac exam no murmur or gallop. Lungs are clear to auscultation. Distal pulses are well felt. Neurological Exam ;  Awake  Alert oriented x 3. Normal speech and language.eye movements full without nystagmus. Mild saccadic dysmetria to right gaze. Right hemifacial spasms present. 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 fundi were not visualized. Vision acuity and fields appear normal. Hearing is normal. Palatal movements are normal. Face asymmetric. Tongue midline. Normal strength, tone, reflexes and coordination. Normal sensation. Gait deferred.  ASSESSMENT [name] is a 79 y.o. 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 Etiology unclear as to dissection versus atherosclerosis.  On no antithrombotic prior to admission. Now on ASA and plavix for secondary stroke prevention.   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   MRI  Left PICA stroke  CTA head neck showed left  VA occlusion  2D Echo  unremarkable  LDL 159, not at goal  HgbA1c 5.8  SCDs for VTE prophylaxis  Diet heart healthy/carb modified Room service appropriate?: Yes; Fluid consistency:: Thin   no antithrombotic prior to admission, now on aspirin 81 mg orally every day and clopidogrel 75 mg orally every day  Patient counseled to be compliant with her antithrombotic medications  Ongoing aggressive stroke risk factor management  Therapy recommendations:  CIR  Disposition:  Pending, transfer to floor  Diabetes  HgbA1c 5.8 goal < 7.0  Controlled  Currently on metformine  CBG monitoring  SSI  DM education  Hypertension  Home meds:   losartan Permissive hypertension (OK if <220/120) for 24-48 hours post stroke and then gradually normalized within 5-7 days. Currently on no BP meds  Stable  Patient counseled to be compliant with her blood pressure medications  Hyperlipidemia  Home meds:  none   LDL 158, goal < 70  Add lipitor 40  Continue statin at discharge  Other Stroke Risk Factors  Advanced age  Other Active Problems  Bow hunter syndrome  Other Pertinent History  Old right bell's palsy  Hospital day # 2   Rosalin Hawking, MD PhD Stroke Neurology 08/29/2014 11:03 AM    To contact Stroke Continuity provider, please refer to [email or web address] After hours, contact General Neurology

## 2014-08-29 NOTE — Evaluation (Addendum)
Occupational Therapy Evaluation Patient Details Name: Holly [name] MRN: [id number] DOB: [birth year] Today's Date: 08/29/2014    History of Present Illness pt is a 79 y.o. female  having experienced episodes of vertigo in the past that was admitted with nausea/vomiting and vertigo with spinning.  MRI shows small cerebellar infarct on left. Pt with history of Bells Palsy.   Clinical Impression   Pt admitted with above. Feel pt will benefit from acute OT to increase independence and strength prior to d/c. 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    Follow Up Recommendations  CIR    Equipment Recommendations  None recommended by OT    Recommendations for Other Services       Precautions / Restrictions Precautions Precautions: Fall      Mobility Bed Mobility Overal bed mobility: Modified Independent                Transfers Overall transfer level: Needs assistance   Transfers: Sit to/from Stand Sit to Stand: Min guard              Balance  Min guard for ambulation with RW.                                          ADL Overall ADL's : Needs assistance/impaired Eating/Feeding: Independent;Sitting   Grooming: Oral care;Set up;Supervision/safety;Standing   Upper Body Bathing: Set up;Sitting   Lower Body Bathing: Min guard;Sit to/from stand   Upper Body Dressing : Set up;Sitting   Lower Body Dressing: Min guard;Sit to/from stand   Toilet Transfer: Min guard;Ambulation;RW (chair/bed)   Toileting- Clothing Manipulation and Hygiene: Min guard;Sit to/from stand       Functional mobility during ADLs: Min guard;Rolling walker General ADL Comments: Recommended pt sit down for most of LB dressing. Pt ambulated to sink and performed grooming. Discussed CIR.      Vision Pt wears glasses for reading and driving; reports no change from baseline. Cataract in right eye. Vision Assessment?: Yes Tracking/Visual Pursuits: Other  (comment) (lost it on right a couple of times-feel that pt talking may have contributed to some of this) Visual Fields: No apparent deficits   Perception     Praxis      Pertinent Vitals/Pain Pain Assessment: No/denies pain     Hand Dominance     Extremity/Trunk Assessment Upper Extremity Assessment Upper Extremity Assessment: RUE deficits/detail RUE Deficits / Details: slightly less AROM shoulder flexion; WFL   Lower Extremity Assessment Lower Extremity Assessment: Defer to PT evaluation       Communication Communication Communication: No difficulties   Cognition Arousal/Alertness: Awake/alert Behavior During Therapy: WFL for tasks assessed/performed Overall Cognitive Status: Within Functional Limits for tasks assessed                     General Comments       Exercises       Shoulder Instructions      Home Living Family/patient expects to be discharged to:: Private residence Living Arrangements: Spouse/significant other Available Help at Discharge: Family;Friend(s);Available PRN/intermittently Type of Home: House Home Access: Stairs to enter [employer] of Steps: 3 Entrance Stairs-Rails: Right;Left       Bathroom Shower/Tub: Occupational psychologist: Standard (vanity close)     Home Equipment: Shower seat;Shower seat - built in;Bedside commode      Lives With:  Spouse    Prior Functioning/Environment Level of Independence: Independent        Comments: pt has to keep the household going and help her husband    OT Diagnosis: Generalized weakness   OT Problem List: Decreased strength;Decreased activity tolerance;Decreased knowledge of use of DME or AE;Decreased knowledge of precautions   OT Treatment/Interventions: Self-care/ADL training;DME and/or AE instruction;Therapeutic activities;Patient/family education;Balance training;Visual/perceptual remediation/compensation;Therapeutic exercise    OT Goals(Current goals  can be found in the care plan section) Acute Rehab OT Goals Patient Stated Goal: get back to being independent OT Goal Formulation: With patient Time For Goal Achievement: 09/05/14 Potential to Achieve Goals: Good ADL Goals Pt Will Perform Lower Body Bathing: with modified independence;sit to/from stand Pt Will Perform Lower Body Dressing: with modified independence;sit to/from stand Pt Will Transfer to Toilet: with modified independence;ambulating  OT Frequency: Min 2X/week   Barriers to D/C:            Co-evaluation              End of Session Equipment Utilized During Treatment: Gait belt;Rolling walker  Activity Tolerance: Patient tolerated treatment well Patient left: in bed;with call bell/phone within reach   Time: 7680-8811 OT Time Calculation (min): 17 min Charges:  OT General Charges $OT Visit: 1 Procedure OT Evaluation $Initial OT Evaluation Tier I: 1 Procedure G-CodesBenito Mccreedy OTR/L [id number] 08/29/2014, 4:39 PM

## 2014-08-30 DIAGNOSIS — I6330 Cerebral infarction due to thrombosis of unspecified cerebral artery: Secondary | ICD-10-CM

## 2014-08-30 LAB — BASIC METABOLIC PANEL
Anion gap: 8 (ref 5–15)
BUN: 25 mg/dL — AB (ref 6–20)
CHLORIDE: 104 mmol/L (ref 101–111)
CO2: 26 mmol/L (ref 22–32)
CREATININE: 0.79 mg/dL (ref 0.44–1.00)
Calcium: 8.5 mg/dL — ABNORMAL LOW (ref 8.9–10.3)
GFR calc Af Amer: >60 mL/min (ref >60)
GFR calc non Af Amer: >60 mL/min (ref >60)
Glucose, Bld: 149 mg/dL — ABNORMAL HIGH (ref 70–99)
Potassium: 3.5 mmol/L (ref 3.5–5.1)
Sodium: 138 mmol/L (ref 135–145)

## 2014-08-30 LAB — GLUCOSE, CAPILLARY
GLUCOSE-CAPILLARY: 142 mg/dL — AB (ref 70–99)
GLUCOSE-CAPILLARY: 173 mg/dL — AB (ref 70–99)
Glucose-Capillary: 155 mg/dL — ABNORMAL HIGH (ref 70–99)
Glucose-Capillary: 150 mg/dL — ABNORMAL HIGH (ref 70–99)
Glucose-Capillary: 136 mg/dL — ABNORMAL HIGH (ref 70–99)

## 2014-08-30 LAB — CBC
HEMATOCRIT: 38 % (ref 36.0–46.0)
Hemoglobin: 12.7 g/dL (ref 12.0–15.0)
MCH: 29.6 pg (ref 26.0–34.0)
MCHC: 33.4 g/dL (ref 30.0–36.0)
MCV: 88.6 fL (ref 78.0–100.0)
Platelets: 201 10*3/uL (ref 150–400)
RBC: 4.29 MIL/uL (ref 3.87–5.11)
RDW: 13.1 % (ref 11.5–15.5)
WBC: 8.9 10*3/uL (ref 4.0–10.5)

## 2014-08-30 NOTE — Progress Notes (Signed)
Stroke Team Progress Note  SUBJECTIVE No family is at the bedside.  Overall she feels her condition is improving. No acute issue ovenight. She went to bathroom this morning and feels better on walking  OBJECTIVE Most recent Vital Signs: Filed Vitals:   08/30/14 0145 08/30/14 0552 08/30/14 0916 08/30/14 1315  BP: 141/69 127/72 136/54 112/65  Pulse: 81 75 76 94  Temp: 98.4 F (36.9 C) 98.1 F (36.7 C) 98.1 F (36.7 C) 97.9 F (36.6 C)  TempSrc: Oral Oral Oral Oral  Resp: 16 16 14 16   Height:      Weight:      SpO2: 98% 96% 99% 99%   CBG (last 3)   Recent Labs  08/29/14 2129 08/30/14 0658 08/30/14 1043  GLUCAP 140* 142* 173*    IV Fluid Intake:      MEDICATIONS  .  stroke: mapping our early stages of recovery book   Does not apply Once  . aspirin  81 mg Oral Daily  . atorvastatin  40 mg Oral q1800  . clopidogrel  75 mg Oral Daily  . escitalopram  10 mg Oral Daily  . metFORMIN  500 mg Oral BID WC   PRN:  senna-docusate  Diet:  Diet heart healthy/carb modified Room service appropriate?: Yes; Fluid consistency:: Thin   liquids Activity:  Bedrest  DVT Prophylaxis:  Iv heparin CLINICALLY SIGNIFICANT STUDIES Basic Metabolic Panel:   Recent Labs Lab 08/27/14 1020 08/29/14 0132 08/30/14 0611  NA 139 137 138  K 3.5 3.9 3.5  CL 107 103 104  CO2 21* 26 26  GLUCOSE 265* 163* 149*  BUN 16 18 25*  CREATININE 0.78 0.68 0.79  CALCIUM 9.0 8.8* 8.5*  MG 1.7  --   --    Liver Function Tests:   Recent Labs Lab 08/27/14 1020  AST 19  ALT 14  ALKPHOS 54  BILITOT 1.0  PROT 6.6  ALBUMIN 3.9   CBC:   Recent Labs Lab 08/27/14 1020  08/29/14 0200 08/30/14 0611  WBC 15.7*  < > 9.2 8.9  NEUTROABS 12.6*  --   --   --   HGB 13.2  < > 13.0 12.7  HCT 39.2  < > 39.1 38.0  MCV 88.9  < > 88.1 88.6  PLT 226  < > 217 201  < > = values in this interval not displayed. Coagulation:   Recent Labs Lab 08/27/14 1020  LABPROT 13.8  INR 1.05   Cardiac Enzymes:    Recent Labs Lab 08/28/14 0739 08/28/14 1906 08/29/14 0132  TROPONINI 0.47* 0.32* 0.24*   Urinalysis:   Recent Labs Lab 08/27/14 1253  COLORURINE YELLOW  LABSPEC 1.026  PHURINE 6.5  GLUCOSEU >1000*  HGBUR NEGATIVE  BILIRUBINUR NEGATIVE  KETONESUR 40*  PROTEINUR NEGATIVE  UROBILINOGEN 0.2  NITRITE NEGATIVE  LEUKOCYTESUR NEGATIVE   Lipid Panel    Component Value Date/Time   CHOL 232* 08/28/2014 0739   TRIG 97 08/28/2014 0739   HDL 54 08/28/2014 0739   CHOLHDL 4.3 08/28/2014 0739   VLDL 19 08/28/2014 0739   LDLCALC 159* 08/28/2014 0739   HgbA1C  Lab Results  Component Value Date   HGBA1C 6.8* 08/28/2014    Urine Drug Screen:  No results found for: LABOPIA, COCAINSCRNUR, LABBENZ, AMPHETMU, THCU, LABBARB  Alcohol Level: No results for input(s): ETH in the last 168 hours.  I have personally reviewed the radiological images below and agree with the radiology interpretations.  Ct Angio head and Neck W/cm &/or  Wo/cm  08/27/2014   IMPRESSION: 1. 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 Acute occlusion, including from a dissection, is possible. 2. Patent and dominant right vertebral artery. 3. No cervical carotid artery stenosis. 4. Intracranial atherosclerosis without significant proximal stenosis. No major intracranial arterial occlusion. 5. 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   Mr Brain Wo Contrast  08/28/2014  IMPRESSION: Acute LEFT posterior inferior cerebellar artery territory infarct assist 2 with LEFT vertebral artery occlusion.  Small remote RIGHT cerebellar infarct. Involutional changes. Minimal white matter changes suggest chronic small vessel ischemic disease.    2D Echocardiogram  Normal LV function; moderate LVH; grade 1 diastolic dysfunction; cannot exclude wall motion abnormality; mild MR.  EKG   Sinus rhythm with 1st degree A-V block Prolonged QT Abnormal ECG  Physical Exam   Frail middle aged lady not in distress. .  Afebrile. Head is nontraumatic. Neck is supple without bruit.    Cardiac exam no murmur or gallop. Lungs are clear to auscultation. Distal pulses are well felt. Neurological Exam ;  Awake  Alert oriented x 3. Normal speech and language.eye movements full without nystagmus. Mild saccadic dysmetria to right gaze. Right hemifacial spasms present. 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 fundi were not visualized. Vision acuity and fields appear normal. Hearing is normal. Palatal movements are normal. Face asymmetric. Tongue midline. Normal strength, tone, reflexes and coordination. Normal sensation. Gait deferred.  ASSESSMENT [name] is a 80 y.o. 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 Etiology unclear as to dissection versus atherosclerosis.  On no antithrombotic prior to admission. Now on ASA and plavix for secondary stroke prevention.   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   MRI  Left PICA stroke  CTA head neck showed left VA occlusion  2D Echo  unremarkable  LDL 159, not at goal  HgbA1c 5.8  SCDs for VTE prophylaxis  Diet heart healthy/carb modified Room service appropriate?: Yes; Fluid consistency:: Thin   no antithrombotic prior to admission, now on aspirin 81 mg orally every day and clopidogrel 75 mg orally every day  Patient counseled to be compliant with her antithrombotic medications  Ongoing aggressive stroke risk factor management  Therapy recommendations:  CIR  Disposition:  Pending  Diabetes  HgbA1c 5.8 goal < 7.0  Controlled  Currently on metformine  CBG monitoring  SSI  DM education  Hypertension  Home  meds:   losartan Currently on no BP meds BP goal normotensive now  Stable  Hyperlipidemia  Home meds:  none   LDL 158, goal < 70  Add lipitor 40  Continue statin at discharge  Other Stroke Risk Factors  Advanced age  Other Active Problems  Bow hunter syndrome  Other Pertinent History  Old right bell's palsy  Hospital day # 3   Holly Hawking, MD PhD Stroke Neurology 08/30/2014 3:02 PM    To contact Stroke Continuity provider, please refer to [email or web address] After hours, contact General Neurology

## 2014-08-30 NOTE — Consult Note (Signed)
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 Erlinda Hong   HPI: Holly [name] is a 79 y.o. right handed female with history of hypertension, diabetes mellitus and peripheral neuropathy. Patient lives with spouse independent prior to admission. Admitted 08/27/2014 with increased dizziness described as vertigo as well as nausea vomiting and left-sided weakness. 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 Echocardiogram with ejection fraction of 97% grade 1 diastolic dysfunction. Patient did not receive TPA. CT angio head and neck again shows occlusion of left vertebral artery at the C2 level. Neurology consulted maintained on aspirin and Plavix for CVA prophylaxis. Hospital course consult to cardiology services for bradycardia and high-grade AV heart block during vomiting episode identified per EKG felt to be secondary to vagal influence of stroke. Troponin mildly elevated felt to be insignificant. No indication for further workup. Patient maintained on a regular diet. Physical and occupational therapy evaluations completed with recommendations of physical medicine rehabilitation consult.   Review of Systems  Gastrointestinal: Positive for nausea and vomiting.  Musculoskeletal: Positive for myalgias.  Neurological: Positive for dizziness.       Vertigo  All other systems reviewed and are negative.  Past Medical History  Diagnosis Date  . Vertigo   . Hypertension   . Diabetes mellitus without complication     non insulin dependent   History reviewed. No pertinent past surgical history. No family history on file. Social History:  reports that she has never smoked. She does not have any smokeless tobacco history on file. She reports that  she does not drink alcohol or use illicit drugs. Allergies: No Known Allergies Medications Prior to Admission  Medication Sig Dispense Refill  . Coenzyme Q10 (CO Q 10) 10 MG CAPS Take 10 mg by mouth daily.    Marland Kitchen escitalopram (LEXAPRO) 10 MG tablet Take 10 mg by mouth daily.    Marland Kitchen losartan (COZAAR) 25 MG tablet Take 25 mg by mouth daily.    . metFORMIN (GLUCOPHAGE) 500 MG tablet Take 500 mg by mouth 2 (two) times daily with a meal.    . Red Yeast Rice 600 MG TABS Take 600 mg by mouth daily.      Home: Home Living Family/patient expects to be discharged to:: Private residence Living Arrangements: Spouse/significant other Available Help at Discharge: Family, Friend(s), Available PRN/intermittently Type of Home: House Home Access: Stairs to enter Technical brewer of Steps: 3 Entrance Stairs-Rails: Right, Left Home Equipment: Shower seat, Shower seat - built in, Bedside commode  Lives With: Spouse  Functional History: Prior Function Level of Independence: Independent Comments: pt has to keep the household going and help her husband Functional Status:  Mobility: Bed Mobility Overal bed mobility: Modified Independent Bed Mobility: Supine to Sit Supine to sit: Min assist General bed mobility comments: Constant cuing for compensatory strategies, but pt unable to focus due to anxious with modesty Transfers Overall transfer level: Needs assistance Transfers: Sit to/from Stand Sit to Stand: Min guard Stand pivot transfers: Mod assist General transfer comment: became nauseated and dizzy with movement.  Could not follow the compensation due to anxiety. Ambulation/Gait General Gait Details: Unable due to dizziness and nausea    ADL: ADL Overall ADL's : Needs assistance/impaired Eating/Feeding: Independent, Sitting Grooming: Oral care, Set up, Supervision/safety, Standing Upper Body Bathing: Set  up, Sitting Lower Body Bathing: Min guard, Sit to/from stand Upper Body Dressing :  Set up, Sitting Lower Body Dressing: Min guard, Sit to/from stand Toilet Transfer: Min guard, Ambulation, RW (chair/bed) Toileting- Clothing Manipulation and Hygiene: Min guard, Sit to/from stand Functional mobility during ADLs: Min guard, Rolling walker General ADL Comments: Recommended pt sit down for most of LB dressing. Pt ambulated to sink and performed grooming.   Cognition: Cognition Overall Cognitive Status: Within Functional Limits for tasks assessed Arousal/Alertness: Awake/alert Orientation Level: Oriented X4 Memory: Appears intact Awareness: Appears intact Problem Solving: Appears intact Safety/Judgment: Appears intact Cognition Arousal/Alertness: Awake/alert Behavior During Therapy: WFL for tasks assessed/performed Overall Cognitive Status: Within Functional Limits for tasks assessed  Blood pressure 136/54, pulse 76, temperature 98.1 F (36.7 C), temperature source Oral, resp. rate 14, height 5\' 7"  (1.702 m), weight 71.8 kg (158 lb 4.6 oz), SpO2 99 %. Physical Exam  Constitutional: She is oriented to person, place, and time. She appears well-developed.  HENT:  Head: Normocephalic.  Eyes: EOM are normal.  Neck: Normal range of motion. Neck supple. No thyromegaly present.  Cardiovascular: Normal rate and regular rhythm.   Respiratory: Effort normal and breath sounds normal. No respiratory distress.  GI: Soft. Bowel sounds are normal. She exhibits no distension.  Neurological: She is alert and oriented to person, place, and time.  Follows commands, speech clear. Right lid droop. Gaze appears slightly dysconjugate. Mild decrease of Posey on left, mild ataxia. Strength 4+/5 prox to distal bilateral upper and lower extremities. No gross sensory deficits  Skin: Skin is warm and dry.  Psychiatric:  Anxious but pleasant    Results for orders placed or performed during the hospital encounter of 08/27/14 (from the past 24 hour(s))  Glucose, capillary     Status: Abnormal    Collection Time: 08/29/14 11:47 AM  Result Value Ref Range   Glucose-Capillary 162 (H) 70 - 99 mg/dL   Comment 1 Notify RN    Comment 2 Document in Chart   Glucose, capillary     Status: Abnormal   Collection Time: 08/29/14  9:29 PM  Result Value Ref Range   Glucose-Capillary 140 (H) 70 - 99 mg/dL  Basic metabolic panel     Status: Abnormal   Collection Time: 08/30/14  6:11 AM  Result Value Ref Range   Sodium 138 135 - 145 mmol/L   Potassium 3.5 3.5 - 5.1 mmol/L   Chloride 104 101 - 111 mmol/L   CO2 26 22 - 32 mmol/L   Glucose, Bld 149 (H) 70 - 99 mg/dL   BUN 25 (H) 6 - 20 mg/dL   Creatinine, Ser 0.79 0.44 - 1.00 mg/dL   Calcium 8.5 (L) 8.9 - 10.3 mg/dL   GFR calc non Af Amer >60 >60 mL/min   GFR calc Af Amer >60 >60 mL/min   Anion gap 8 5 - 15  CBC     Status: None   Collection Time: 08/30/14  6:11 AM  Result Value Ref Range   WBC 8.9 4.0 - 10.5 K/uL   RBC 4.29 3.87 - 5.11 MIL/uL   Hemoglobin 12.7 12.0 - 15.0 g/dL   HCT 38.0 36.0 - 46.0 %   MCV 88.6 78.0 - 100.0 fL   MCH 29.6 26.0 - 34.0 pg   MCHC 33.4 30.0 - 36.0 g/dL   RDW 13.1 11.5 - 15.5 %   Platelets 201 150 - 400 K/uL  Glucose, capillary     Status: Abnormal   Collection Time:  08/30/14  6:58 AM  Result Value Ref Range   Glucose-Capillary 142 (H) 70 - 99 mg/dL   Comment 1 Notify RN    Comment 2 Document in Chart    No results found.  Assessment/Plan: Diagnosis: left PICA infarct 1. Does the need for close, 24 hr/day medical supervision in concert with the patient's rehab needs make it unreasonable for this patient to be served in a less intensive setting? Yes 2. Co-Morbidities requiring supervision/potential complications: htn, VA dissection 3. Due to bladder management, bowel management, safety, skin/wound care, disease management, medication administration and patient education, does the patient require 24 hr/day rehab nursing? Yes 4. Does the patient require coordinated care of a physician, rehab nurse, PT  (1-2 hrs/day, 5 days/week) and OT (1-2 hrs/day, 5 days/week) to address physical and functional deficits in the context of the above medical diagnosis(es)? Yes Addressing deficits in the following areas: balance, endurance, locomotion, strength, transferring, bowel/bladder control, bathing, dressing, feeding, grooming, toileting and psychosocial support 5. Can the patient actively participate in an intensive therapy program of at least 3 hrs of therapy per day at least 5 days per week? Yes 6. The potential for patient to make measurable gains while on inpatient rehab is excellent 7. Anticipated functional outcomes upon discharge from inpatient rehab are modified independent  with PT, modified independent with OT, n/a with SLP. 8. Estimated rehab length of stay to reach the above functional goals is: 7 days 9. Does the patient have adequate social supports and living environment to accommodate these discharge functional goals? Yes and Potentially 10. Anticipated D/C setting: Home 11. Anticipated post D/C treatments: [place] therapy 12. Overall Rehab/Functional Prognosis: excellent  RECOMMENDATIONS: This patient's condition is appropriate for continued rehabilitative care in the following setting: CIR Patient has agreed to participate in recommended program. Yes Note that insurance prior authorization may be required for reimbursement for recommended care.  Comment: Would like to see updated therapy assessments for today. Husband at home but has mobility needs of his own----would only be able to provide limited supervision. Rehab Admissions Coordinator to follow up.  Thanks,  Meredith Staggers, MD, Mellody Drown     08/30/2014

## 2014-08-31 LAB — CBC
HCT: 37.5 % (ref 36.0–46.0)
HEMOGLOBIN: 12.3 g/dL (ref 12.0–15.0)
MCH: 29.2 pg (ref 26.0–34.0)
MCHC: 32.8 g/dL (ref 30.0–36.0)
MCV: 89.1 fL (ref 78.0–100.0)
Platelets: 210 10*3/uL (ref 150–400)
RBC: 4.21 MIL/uL (ref 3.87–5.11)
RDW: 13 % (ref 11.5–15.5)
WBC: 8 10*3/uL (ref 4.0–10.5)

## 2014-08-31 LAB — GLUCOSE, CAPILLARY
Glucose-Capillary: 142 mg/dL — ABNORMAL HIGH (ref 70–99)
Glucose-Capillary: 164 mg/dL — ABNORMAL HIGH (ref 70–99)
Glucose-Capillary: 206 mg/dL — ABNORMAL HIGH (ref 70–99)
Glucose-Capillary: 134 mg/dL — ABNORMAL HIGH (ref 70–99)

## 2014-08-31 LAB — BASIC METABOLIC PANEL
Anion gap: 8 (ref 5–15)
BUN: 21 mg/dL — ABNORMAL HIGH (ref 6–20)
CO2: 24 mmol/L (ref 22–32)
CREATININE: 0.68 mg/dL (ref 0.44–1.00)
Calcium: 8.3 mg/dL — ABNORMAL LOW (ref 8.9–10.3)
Chloride: 105 mmol/L (ref 101–111)
GFR calc non Af Amer: >60 mL/min (ref >60)
Glucose, Bld: 147 mg/dL — ABNORMAL HIGH (ref 70–99)
Potassium: 3.4 mmol/L — ABNORMAL LOW (ref 3.5–5.1)
Sodium: 137 mmol/L (ref 135–145)

## 2014-08-31 MED ORDER — LOSARTAN POTASSIUM 50 MG PO TABS
25.0000 mg | ORAL_TABLET | Freq: Every day | ORAL | Status: DC
  Administered 2014-08-31 – 2014-09-01 (×2): 25 mg via ORAL
  Filled 2014-08-31 (×2): qty 1

## 2014-08-31 NOTE — Progress Notes (Signed)
I met with pt at bedside to discuss her options of rehab venue. Pt prefers rehab at [facility] if bed is available. Does not want to pursue SNF at any other SNF but there. If bed is not available at [facility], she would then like to discuss with me the possibility of inpt rehab admission pending my clarification of cost for inpt rehab. I will notify SW of pt's preference, and I will also clarify cost of inpt rehab and discuss with pt that cost today. [phone]

## 2014-08-31 NOTE — Progress Notes (Signed)
Physical Therapy Treatment Patient Details Name: Holly [name] MRN: [id number] DOB: 09/25/1935 Today's Date: 08/31/2014    History of Present Illness pt is a 79 y.o. female  having experienced episodes of vertigo in the past that was admitted with nausea/vomiting and vertigo with spinning.  MRI shows small cerebellar infarct on left. Pt with history of Bells Palsy.    PT Comments    Pt indicates improvement in her dizziness and nausea today.  Pt able to use gaze stabilization technique during mobility to minimize dizziness and feelings of being off balance.  Pt requires MinA when mobilizing without AD or when presented with small balance challenges such as vertical and horizontal head turns.  Continue to feel pt would benefit from CIR at D/C to return to Modified Independent prior to returning to home.  Will continue to follow.    Follow Up Recommendations  CIR     Equipment Recommendations   (TBD)    Recommendations for Other Services       Precautions / Restrictions Precautions Precautions: Fall Restrictions Weight Bearing Restrictions: No    Mobility  Bed Mobility Overal bed mobility: Modified Independent                Transfers Overall transfer level: Needs assistance Equipment used: Rolling walker (2 wheeled) Transfers: Sit to/from Stand Sit to Stand: Min guard         General transfer comment: pt doing well with stabilizing gaze during transfers and indicates dizziness is minimal when she does this.  Cues for UE use.    Ambulation/Gait Ambulation/Gait assistance: Min assist Ambulation Distance (Feet): 300 Feet Assistive device: Rolling walker (2 wheeled);None Gait Pattern/deviations: Step-through pattern;Decreased stride length     General Gait Details: pt keeps strong grip on RW for security and uses the UE support to help minimize dizziness.  pt able to perform vertical and horizontal head turns with MinA.  pt indicates increased dizziness during  vertical head turns.  Trialed ambulation without AD and pt needs MinA for balance/stability when not challenged with head turns.     Stairs            Wheelchair Mobility    Modified Rankin (Stroke Patients Only) Modified Rankin (Stroke Patients Only) Pre-Morbid Rankin Score: No symptoms Modified Rankin: Moderately severe disability     Balance Overall balance assessment: Needs assistance Sitting-balance support: No upper extremity supported;Feet supported Sitting balance-Leahy Scale: Fair Sitting balance - Comments: Unable to lean anteriorly or reach towards floor without causing increased dizziness.     Standing balance support: Bilateral upper extremity supported;No upper extremity supported;During functional activity Standing balance-Leahy Scale: Poor Standing balance comment: pt needs UE support for stability or MinA without UE support.                      Cognition Arousal/Alertness: Awake/alert Behavior During Therapy: WFL for tasks assessed/performed Overall Cognitive Status: Within Functional Limits for tasks assessed                      Exercises      General Comments        Pertinent Vitals/Pain Pain Assessment: No/denies pain    Home Living                      Prior Function            PT Goals (current goals can now be found in the care plan  section) Acute Rehab PT Goals Patient Stated Goal: get back to being independent PT Goal Formulation: With patient Time For Goal Achievement: 09/11/14 Potential to Achieve Goals: Good Progress towards PT goals: Progressing toward goals    Frequency  Min 3X/week    PT Plan Current plan remains appropriate    Co-evaluation             End of Session Equipment Utilized During Treatment: Gait belt Activity Tolerance: Patient tolerated treatment well Patient left: in bed;with call bell/phone within reach;with bed alarm set     Time: 6015-6153 PT Time Calculation  (min) (ACUTE ONLY): 31 min  Charges:  $Gait Training: 8-22 mins $Therapeutic Activity: 8-22 mins                    G CodesCatarina Hartshorn, [place] 08/31/2014, 10:27 AM

## 2014-08-31 NOTE — Progress Notes (Signed)
Received message from [place] with [place], that she wanted to go home with [facility] services; Patient decided that she wanted to go to a SNF near her home for rehab and if that was not available, she will go home at discharge; [place] Worker made aware and to talk with the patient about other nursing facilities; Aneta Mins [phone]

## 2014-08-31 NOTE — Progress Notes (Addendum)
I spoke with pt and advised her of her benefits coverage for inpt rehab with her [facility] as well as SNF coverage. She states her daughter will be out of work taking care of pt's husband through next week Pt wants to discuss further with her daughter to clarify if she could be discharged home with hh and 24/7 assist of daughter rather than pursue any other rehab venue. Pt will contact me with her decision. [phone] Pt has just contacted me by phone and states she and daughter wish for her to d/c home with [facility] and pt requests a RW for home use. I will notify RN CM. [phone]

## 2014-08-31 NOTE — Clinical Social Work Note (Signed)
CSW met the pt at the bedside. CSW introduce self and purpose visit. CSW explained the SNF process to the pt. The pt reported being confused about rehab. The pt reported believing that she could remain at the hospital to receive rehab. The pt reported that she waited to receive rehab for 1 or 2 day then go home. The pt reported that she would prefer to go home with her daughter Lattie Haw. The pt gave CSW permission to call Lattie Haw. CSW called Lattie Haw to explain the SNF process. Lattie Haw reported that she will remain with her mother for the 1st seven days. Lattie Haw reported being unsure if the pt would need HHPT, since she would care for the pt after discharge. Lattie Haw requested time to talk to the pt to determine if HHPT would be necessary. CSW updated case Freight forwarder. CSW will sign off.    [place], [place]

## 2014-08-31 NOTE — Progress Notes (Signed)
Stroke Team Progress Note  SUBJECTIVE No family at bedside. Patient working with Education officer, museum and rehab admissions coordinator for disposition, hopefully today.   OBJECTIVE Most recent Vital Signs: Filed Vitals:   08/30/14 2133 08/31/14 0140 08/31/14 0608 08/31/14 1000  BP: 158/79 130/58 156/76 132/63  Pulse: 70 75 73 75  Temp: 98.5 F (36.9 C) 98.3 F (36.8 C) 98 F (36.7 C) 97.6 F (36.4 C)  TempSrc: Oral Oral Oral Oral  Resp: 16 16 16 20   Height:      Weight:      SpO2: 96% 98% 98% 99%   CBG (last 3)   Recent Labs  08/30/14 1557 08/30/14 2137 08/31/14 0650  GLUCAP 150* 136* 142*    IV Fluid Intake:      MEDICATIONS  .  stroke: mapping our early stages of recovery book   Does not apply Once  . aspirin  81 mg Oral Daily  . atorvastatin  40 mg Oral q1800  . clopidogrel  75 mg Oral Daily  . escitalopram  10 mg Oral Daily  . metFORMIN  500 mg Oral BID WC   PRN:  senna-docusate  Diet:  Diet heart healthy/carb modified Room service appropriate?: Yes; Fluid consistency:: Thin   liquids Activity: OOB DVT Prophylaxis:  Iv heparin  CLINICALLY SIGNIFICANT STUDIES Basic Metabolic Panel:   Recent Labs Lab 08/27/14 1020  08/30/14 0611 08/31/14 0555  NA 139  < > 138 137  K 3.5  < > 3.5 3.4*  CL 107  < > 104 105  CO2 21*  < > 26 24  GLUCOSE 265*  < > 149* 147*  BUN 16  < > 25* 21*  CREATININE 0.78  < > 0.79 0.68  CALCIUM 9.0  < > 8.5* 8.3*  MG 1.7  --   --   --   < > = values in this interval not displayed. Liver Function Tests:   Recent Labs Lab 08/27/14 1020  AST 19  ALT 14  ALKPHOS 54  BILITOT 1.0  PROT 6.6  ALBUMIN 3.9   CBC:   Recent Labs Lab 08/27/14 1020  08/30/14 0611 08/31/14 0555  WBC 15.7*  < > 8.9 8.0  NEUTROABS 12.6*  --   --   --   HGB 13.2  < > 12.7 12.3  HCT 39.2  < > 38.0 37.5  MCV 88.9  < > 88.6 89.1  PLT 226  < > 201 210  < > = values in this interval not displayed. Coagulation:   Recent Labs Lab 08/27/14 1020   LABPROT 13.8  INR 1.05   Cardiac Enzymes:   Recent Labs Lab 08/28/14 0739 08/28/14 1906 08/29/14 0132  TROPONINI 0.47* 0.32* 0.24*   Urinalysis:   Recent Labs Lab 08/27/14 1253  COLORURINE YELLOW  LABSPEC 1.026  PHURINE 6.5  GLUCOSEU >1000*  HGBUR NEGATIVE  BILIRUBINUR NEGATIVE  KETONESUR 40*  PROTEINUR NEGATIVE  UROBILINOGEN 0.2  NITRITE NEGATIVE  LEUKOCYTESUR NEGATIVE   Lipid Panel    Component Value Date/Time   CHOL 232* 08/28/2014 0739   TRIG 97 08/28/2014 0739   HDL 54 08/28/2014 0739   CHOLHDL 4.3 08/28/2014 0739   VLDL 19 08/28/2014 0739   LDLCALC 159* 08/28/2014 0739   HgbA1C  Lab Results  Component Value Date   HGBA1C 6.8* 08/28/2014    Urine Drug Screen:  No results found for: LABOPIA, COCAINSCRNUR, LABBENZ, AMPHETMU, THCU, LABBARB  Alcohol Level: No results for input(s): ETH in the last 168  hours.   Ct Angio head and Neck W/cm &/or Wo/cm  08/27/2014   IMPRESSION: 1. 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 Acute occlusion, including from a dissection, is possible. 2. Patent and dominant right vertebral artery. 3. No cervical carotid artery stenosis. 4. Intracranial atherosclerosis without significant proximal stenosis. No major intracranial arterial occlusion. 5. 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   Mr Brain Wo Contrast  08/28/2014  IMPRESSION: Acute LEFT posterior inferior cerebellar artery territory infarct assist 2 with LEFT vertebral artery occlusion.  Small remote RIGHT cerebellar infarct. Involutional changes. Minimal white matter changes suggest chronic small vessel ischemic disease.    2D Echocardiogram  Normal LV function; moderate LVH; grade 1 diastolic dysfunction; cannot exclude wall motion abnormality; mild MR.  EKG   Sinus rhythm with 1st degree A-V block. Prolonged QT. Abnormal ECG   Physical Exam   Frail middle aged lady not in distress. . Afebrile. Head is nontraumatic. Neck is supple without bruit.     Cardiac exam no murmur or gallop. Lungs are clear to auscultation. Distal pulses are well felt. Neurological Exam ;  Awake  Alert oriented x 3. Normal speech and language.eye movements full without nystagmus. Mild saccadic dysmetria to right gaze. Right hemifacial spasms present. 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 fundi were not visualized. Vision acuity and fields appear normal. Hearing is normal. Palatal movements are normal. Face asymmetric. Tongue midline. Normal strength, tone, reflexes and coordination. Normal sensation. Gait deferred.  ASSESSMENT Holly [name] is a 79 y.o. 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 Etiology unclear as to dissection versus atherosclerosis.  On no antithrombotic prior to admission. Now on ASA and plavix for secondary stroke prevention.   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   MRI  Left PICA stroke  CTA head neck showed left VA occlusion  2D Echo  unremarkable  LDL 159, not at goal  HgbA1c 5.8  SCDs for VTE prophylaxis  Diet heart healthy/carb modified Room service appropriate?: Yes; Fluid consistency:: Thin   no antithrombotic prior to admission, now on aspirin 81 mg orally every day and clopidogrel 75 mg orally every day  Patient counseled to be compliant with her antithrombotic medications  Ongoing aggressive stroke risk factor management  Therapy recommendations:  CIR  Disposition:  CIR vs SNF at [facility]. Admissions coordinator & social worker are following. Patient medically ready for discharge once bed obtained  Diabetes  HgbA1c 5.8 goal < 7.0  Controlled  Currently on  metformine  CBG monitoring  SSI  DM education  Hypertension  Home meds:   losartan  Currently on no BP meds  BP goal normotensive now  Resume home medications  Hyperlipidemia  Home meds:  none   LDL 158, goal < 70  Add lipitor 40  Continue statin at discharge  Other Stroke Risk Factors  Advanced age  Other Active Problems  Bow hunter syndrome  Other Pertinent History  Old right bell's palsy  Hospital day # [place] for Pager information 08/31/2014 12:56 PM   I, the attending vascular neurologist, have personally obtained a history, examined the  patient, evaluated laboratory data, individually viewed imaging studies and agree with radiology interpretations. Together with the NP/PA, we formulated the assessment and plan of care which reflects our mutual decision.  I have made any additions or clarifications directly to the above note and agree with the findings and plan as currently documented.   79 yo F with hx of HTN, DM, bell's palsy and bow hunter syndrome was admitted for left VA occlusion and left PICA stroke. Symptoms resolved. On dural antiplatelet. Waiting for placement. Recommend pt be cautious with head movement.  Holly Hawking, MD PhD Stroke Neurology 08/31/2014 5:34 PM       To contact Stroke Continuity provider, please refer to [email or web address] After hours, contact General Neurology

## 2014-08-31 NOTE — Progress Notes (Signed)
UR COMPLETED  

## 2014-09-01 DIAGNOSIS — E119 Type 2 diabetes mellitus without complications: Secondary | ICD-10-CM

## 2014-09-01 DIAGNOSIS — E118 Type 2 diabetes mellitus with unspecified complications: Secondary | ICD-10-CM

## 2014-09-01 DIAGNOSIS — E785 Hyperlipidemia, unspecified: Secondary | ICD-10-CM | POA: Diagnosis present

## 2014-09-01 LAB — GLUCOSE, CAPILLARY
Glucose-Capillary: 122 mg/dL — ABNORMAL HIGH (ref 70–99)
Glucose-Capillary: 186 mg/dL — ABNORMAL HIGH (ref 70–99)

## 2014-09-01 MED ORDER — ATORVASTATIN CALCIUM 40 MG PO TABS: 40.0000 mg | ORAL_TABLET | Freq: Every day | ORAL | Status: DC

## 2014-09-01 MED ORDER — CLOPIDOGREL BISULFATE 75 MG PO TABS: 75.0000 mg | ORAL_TABLET | Freq: Every day | ORAL | Status: DC

## 2014-09-01 MED ORDER — ASPIRIN 81 MG PO CHEW: 81.0000 mg | CHEWABLE_TABLET | Freq: Every day | ORAL | Status: DC

## 2014-09-01 NOTE — Progress Notes (Signed)
Occupational Therapy Treatment Patient Details Name: Holly [name] MRN: [id number] DOB: [birth year] Today's Date: 09/01/2014    History of present illness pt is a 79 y.o. female  having experienced episodes of vertigo in the past that was admitted with nausea/vomiting and vertigo with spinning.  MRI shows small cerebellar infarct on left. Pt with history of Bells Palsy.   OT comments  Pt demonstrates executive functioning cognitive deficits and fall risk. Pt needs (A) with medications and meals. Pt currently manages personal medications and spouses medication. Pt during return demonstration provided double dosage to OT reading the mock pill bottle and did not recognize error. Daughter made aware of risk. Pt reports preior to admission omitting medications based on symptoms but states "i will take whatever they give me now." pt advised to discuss with medical staff why previous medications were not taken so that the issue can be addressed fully.   Follow Up Recommendations  Home health OT;Supervision - Intermittent    Equipment Recommendations  None recommended by OT    Recommendations for Other Services      Precautions / Restrictions Precautions Precautions: Fall Restrictions Weight Bearing Restrictions: No       Mobility Bed Mobility Overal bed mobility: Modified Independent                Transfers Overall transfer level: Needs assistance Equipment used: None Transfers: Sit to/from Stand Sit to Stand: Supervision         General transfer comment: pt needs UE support and attempts to avoid looking down to prevent dizziness.      Balance Overall balance assessment: Needs assistance Sitting-balance support: No upper extremity supported;Feet supported Sitting balance-Leahy Scale: Good     Standing balance support: Single extremity supported;During functional activity Standing balance-Leahy Scale: Poor Standing balance comment: pt needs UE support when leaning  anteriorly to perform peri hygiene due to dizziness.                     ADL Overall ADL's : Needs assistance/impaired Eating/Feeding: Independent;Sitting   Grooming: Wash/dry hands;Wash/dry face;Oral care;Applying deodorant;Brushing hair;Min guard;Standing Grooming Details (indicate cue type and reason): pt leaning against counter for balance Upper Body Bathing: Standing;Min guard   Lower Body Bathing: Min guard;Sit to/from stand Lower Body Bathing Details (indicate cue type and reason): pt required 1 UE on counter for balance at all times Upper Body Dressing : Min guard;Standing   Lower Body Dressing: Min guard;Sit to/from stand Lower Body Dressing Details (indicate cue type and reason): pt attempting to don in standing and then due to LOB and self correction recognized error and sat to dress. Pt attempting to make same error again in same session demonstrating poor probelm solving with carry over             Functional mobility during ADLs: Min guard General ADL Comments: Pt and daughter educated on medication, driving and meals as 3 main areas of concern upon d/c. Pt s daughter reports she has purchased a pill box. Pt daughter plans to purchase frozen dinners for parents and educated that sodium levels in meals are not the best option for patients. Daughter states "oh okay ...Marland KitchenMarland KitchenMarland Kitchenhmmm wonder what we can do" Pt and daughter educated on using crock pot adn prep meals for the week. pt advised not to drive at this time and to request medication release from doctor prior      Vision  Additional Comments: closing R eye some during session   Perception     Praxis      Cognition   Behavior During Therapy: WFL for tasks assessed/performed Overall Cognitive Status: Impaired/Different from baseline Area of Impairment: Memory;Safety/judgement;Awareness;Problem solving     Memory: Decreased short-term memory;Decreased recall of precautions     Safety/Judgement: Decreased awareness of safety;Decreased awareness of deficits Awareness: Anticipatory Problem Solving: Slow processing;Difficulty sequencing General Comments: Pt provided medication on the counter to simulate spouse and patients medications. pt demonstrates poor medication management by reading label x3 times, dosing medication twice and inability to sort medications. PT and daughter educated on risk for medication management at home    [facility] Assessment               Exercises     Shoulder Instructions       General Comments      Pertinent Vitals/ Pain       Pain Assessment: No/denies pain  Home Living                                          Prior Functioning/Environment              Frequency Min 2X/week     Progress Toward Goals  OT Goals(current goals can now be found in the care plan section)  Progress towards OT goals: Progressing toward goals  Acute Rehab OT Goals Patient Stated Goal: get back to being independent OT Goal Formulation: With patient Time For Goal Achievement: 09/05/14 Potential to Achieve Goals: Good ADL Goals Pt Will Perform Lower Body Bathing: with modified independence;sit to/from stand Pt Will Perform Lower Body Dressing: with modified independence;sit to/from stand Pt Will Transfer to Toilet: with modified independence;ambulating  Plan Discharge plan remains appropriate    Co-evaluation                 End of Session Equipment Utilized During Treatment: Gait belt   Activity Tolerance Patient tolerated treatment well   Patient Left in bed;with call bell/phone within reach;with family/visitor present   Nurse Communication Mobility status;Precautions        Time: 1100-1153 OT Time Calculation (min): 53 min  Charges: OT General Charges $OT Visit: 1 Procedure OT Treatments $Self Care/Home Management : 53-67 mins  Peri Maris 09/01/2014, 12:44 PM Pager:  [phone]

## 2014-09-01 NOTE — Progress Notes (Signed)
[facility] called for rolling walker; patient does not want to wait for walker to be delivered to her room. Per [facility] with [facility], the walker will be delivered to her home; Aneta Mins [phone]

## 2014-09-01 NOTE — Discharge Summary (Signed)
Stroke Discharge Summary  Patient ID: Holly [name]   MRN: [id number]      DOB: Oct 20, 1935  Date of Admission: 08/27/2014 Date of Discharge: 09/01/2014  Attending Physician:  Rosalin Hawking, MD, Stroke MD  Consulting Physician(s):     Daneen Schick, MD (cardiology), Alger Simons, MD (Physical Medicine & Rehabtilitation)  Patient's PCP:  No primary care provider on file.  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  Past Medical History  Diagnosis Date  . Vertigo   . Hypertension   . Diabetes mellitus without complication     non insulin dependent   History reviewed. No pertinent past surgical history.    Medication List    STOP taking these medications        Red Yeast Rice 600 MG Tabs      TAKE these medications        aspirin 81 MG chewable tablet  Chew 1 tablet (81 mg total) by mouth daily.     atorvastatin 40 MG tablet  Commonly known as:  LIPITOR  Take 1 tablet (40 mg total) by mouth daily at 6 PM.     clopidogrel 75 MG tablet  Commonly known as:  PLAVIX  Take 1 tablet (75 mg total) by mouth daily.     Co Q 10 10 MG Caps  Take 10 mg by mouth daily.     escitalopram 10 MG tablet  Commonly known as:  LEXAPRO  Take 10 mg by mouth daily.     losartan 25 MG tablet  Commonly known as:  COZAAR  Take 25 mg by mouth daily.     metFORMIN 500 MG tablet  Commonly known as:  GLUCOPHAGE  Take 500 mg by mouth 2 (two) times daily with a meal.        LABORATORY STUDIES CBC    Component Value Date/Time   WBC 8.0 08/31/2014 0555   RBC 4.21 08/31/2014 0555   HGB 12.3 08/31/2014 0555   HCT 37.5 08/31/2014 0555   PLT 210  08/31/2014 0555   MCV 89.1 08/31/2014 0555   MCH 29.2 08/31/2014 0555   MCHC 32.8 08/31/2014 0555   RDW 13.0 08/31/2014 0555   LYMPHSABS 2.3 08/27/2014 1020   MONOABS 0.7 08/27/2014 1020   EOSABS 0.2 08/27/2014 1020   BASOSABS 0.1 08/27/2014 1020   CMP    Component Value Date/Time   NA 137 08/31/2014 0555   K 3.4* 08/31/2014 0555   CL 105 08/31/2014 0555   CO2 24 08/31/2014 0555   GLUCOSE 147* 08/31/2014 0555   BUN 21* 08/31/2014 0555   CREATININE 0.68 08/31/2014 0555   CALCIUM 8.3* 08/31/2014 0555   PROT 6.6 08/27/2014 1020   ALBUMIN 3.9 08/27/2014 1020   AST 19 08/27/2014 1020   ALT 14 08/27/2014 1020   ALKPHOS 54 08/27/2014 1020   BILITOT 1.0 08/27/2014 1020   GFRNONAA >60 08/31/2014 0555   GFRAA >60 08/31/2014 0555   COAGS Lab Results  Component Value Date   INR 1.05 08/27/2014   Lipid Panel    Component Value Date/Time   CHOL 232* 08/28/2014 0739   TRIG  97 08/28/2014 0739   HDL 54 08/28/2014 0739   CHOLHDL 4.3 08/28/2014 0739   VLDL 19 08/28/2014 0739   LDLCALC 159* 08/28/2014 0739   HgbA1C  Lab Results  Component Value Date   HGBA1C 6.8* 08/28/2014   Cardiac Panel (last 3 results) No results for input(s): CKTOTAL, CKMB, TROPONINI, RELINDX in the last 72 hours. Urinalysis    Component Value Date/Time   COLORURINE YELLOW 08/27/2014 [place] 08/27/2014 1253   LABSPEC 1.026 08/27/2014 1253   PHURINE 6.5 08/27/2014 1253   GLUCOSEU >1000* 08/27/2014 1253   HGBUR NEGATIVE 08/27/2014 1253   BILIRUBINUR NEGATIVE 08/27/2014 1253   KETONESUR 40* 08/27/2014 1253   PROTEINUR NEGATIVE 08/27/2014 1253   UROBILINOGEN 0.2 08/27/2014 1253   NITRITE NEGATIVE 08/27/2014 1253   LEUKOCYTESUR NEGATIVE 08/27/2014 1253   Urine Drug Screen No results found for: LABOPIA, COCAINSCRNUR, LABBENZ, AMPHETMU, THCU, LABBARB  Alcohol Level No results found for: [facility]   SIGNIFICANT DIAGNOSTIC STUDIES  Ct Angio head and Neck W/cm &/or Wo/cm 08/27/2014  IMPRESSION: 1. 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 Acute occlusion, including from a dissection, is possible. 2. Patent and dominant right vertebral artery. 3. No cervical carotid artery stenosis. 4. Intracranial atherosclerosis without significant proximal stenosis. No major intracranial arterial occlusion. 5. 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   Mr Brain Wo Contrast 08/28/2014 IMPRESSION: Acute LEFT posterior inferior cerebellar artery territory infarct assist 2 with LEFT vertebral artery occlusion. Small remote RIGHT cerebellar infarct. Involutional changes. Minimal white matter changes suggest chronic small vessel ischemic disease.   2D Echocardiogram Normal LV function; moderate LVH; grade 1 diastolic dysfunction; cannot exclude wall motion abnormality; mild MR.  EKG Sinus rhythm with 1st degree A-V block. Prolonged QT. Abnormal ECG     HISTORY OF PRESENT ILLNESS [name] ROMMEL is an 79 y.o. female hx of HTN, DM presenting after waking up with symptoms of dizziness (described as vertigo), gait instability, nausea/vomitting and left sided weakness. Reports LSW midnight 5/04 when she went to the bathroom with no difficulty. Woke up this morning 08/27/2014 and noted symptoms as soon as waking up.  Also notes pain on the left side of her lateral/posterior cervical neck region. Reports a multi-year history of similar (though less severe) vertigo and nausea anytime she lays flat and/or extends her neck back. Of note, patient had severe bradycardic episode in the ED while having a bout of emesis. She did not receive tPA as she presented outside of IV tPA window. She was admitted for further evaluation and treatment.    HOSPITAL COURSE [name] is a 79 y.o. 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 Etiology unclear as to dissection  versus atherosclerosis.   Stroke: Left PICA infarct due to left VA occlusion. 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   MRI Left PICA stroke  CTA head neck showed left VA occlusion  2D Echo unremarkable  LDL 159, not at goal  HgbA1c 5.8  no antithrombotic prior to admission, given large vessel disease, started on aspirin 81 mg orally every day and clopidogrel 75 mg orally every day. Will treat with dual antiplatelets x 3 months then plavix alone.  Patient counseled to be compliant with her antithrombotic medications  Ongoing aggressive stroke risk factor  management  Therapy recommendations: CIR.  Much discussion related to discharge diposition. Patient does not wish to pay inpatient rehab copay. Insurance will not approve SNF level care.   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 Daughter to stay with them for 1 week post discharge. Will obtain HH PT, OT and RN follow up. Daughter aware of cognitive limitations of patient and our concerns about future home care.   Diabetes  HgbA1c 5.8 goal < 7.0  Controlled  Currently on metformin  Hypertension  Home meds: losartan  Resumed home medications  BP stable  BP goal normotensive  Hyperlipidemia  Home meds: none   LDL 158, goal < 70  Added lipitor 40  Continue statin at discharge  Other Stroke Risk Factors  Advanced age  Other Active Problems  Bow hunter syndrome  Bradycardia/heart block, resolved (cardiology consulted) likely newly mediated/vasovagal due to associated nausea and vomiting. Follow up with Parachos in [place] as per cardiology note.  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 rate 18, height 5\' 7"  (1.702 m), weight 71.8  kg (158 lb 4.6 oz), SpO2 99 %. Frail middle aged lady not in distress. . Afebrile. Head is nontraumatic. Neck is supple without bruit. Cardiac exam no murmur or gallop. Lungs are clear to auscultation. Distal pulses are well felt. Neurological Exam ;  Awake Alert oriented x 3. Normal speech and language.eye movements full without nystagmus. Mild saccadic dysmetria to right gaze. Right hemifacial spasms present. 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 fundi were not visualized. Vision acuity and fields appear normal. Hearing is normal. Palatal movements are normal. Face asymmetric. Tongue midline. Normal strength, tone, reflexes and coordination. Normal sensation. Gait deferred.  Discharge Diet   Diet heart healthy/carb modified Room service appropriate?: Yes; Fluid consistency:: Thin liquids  DISCHARGE PLAN  Disposition:  Home with HH PT, OT and RN ([place])   aspirin 81 mg orally every day and clopidogrel 75 mg orally every day for secondary stroke prevention x 3 months then plavix alone  Follow-up primary care provider in 2 weeks. Get one if you do not have   Follow up with Sr. Parachos, cardiologist in [place]  Follow-up with Dr. Rosalin Hawking, Stroke Clinic in 2 months.  45 minutes were spent preparing discharge.  [place] for Pager information 09/01/2014 1:29 PM    I, the attending vascular neurologist, have personally obtained a history, examined the patient, evaluated laboratory data, individually viewed imaging studies and agree with radiology interpretations. Together with the NP/PA, we formulated the assessment and plan of care which reflects our mutual decision.  I have made any additions or clarifications directly to the above note and agree with the findings and plan as currently documented.    Rosalin Hawking, MD PhD Stroke Neurology 09/01/2014 4:39 PM

## 2014-09-01 NOTE — Progress Notes (Signed)
Pt  400 ft supervision to min guard assist without an assistive device. I will be unable to obtain approval for an inpt rehab admission with [facility] even if pt would like to pursue. Recommend SNF or HH depending on family support at home. I will contact RN CM. [phone]

## 2014-09-01 NOTE — Progress Notes (Addendum)
Physical Therapy Treatment Patient Details Name: ALEAH AHLGRIM MRN: [id number] DOB: 06/21/1935 Today's Date: 09/01/2014    History of Present Illness pt is a 79 y.o. female  having experienced episodes of vertigo in the past that was admitted with nausea/vomiting and vertigo with spinning.  MRI shows small cerebellar infarct on left. Pt with history of Bells Palsy.    PT Comments    Pt continues to have balance deficits and is demonstrating a poor awareness of deficits and safety.  Pt unable to problem solve use of signs in hallway to find rooms without cueing.  Pt indicates she has difficulty seeing distance and is unable to see room numbers from across the hall.  Discussed that pt should not be driving due to visual deficits and the fact that she can not turn her head without feeling dizzy, and pt only mentions that she will just have to be cautious.  Pt expressing concerns about her medications and not knowing what she is supposed to do or if she is supposed to have surgery or if she should see her husband's cardiologist.  Strongly expressed that pt will need help at home and that she should not be driving until dizziness has resolved and she has her vision checked.  Feel pt would still benefit from CIR, however she is insisting on going home.  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  Pt is not safe to return to driving, home management, or managing her and her husband's meds.    Follow Up Recommendations  CIR     Equipment Recommendations  Rolling walker with 5" wheels    Recommendations for Other Services       Precautions / Restrictions Precautions Precautions: Fall Restrictions Weight Bearing Restrictions: No    Mobility  Bed Mobility Overal bed mobility: Modified Independent                Transfers Overall transfer  level: Needs assistance Equipment used: None Transfers: Sit to/from Stand Sit to Stand: Supervision         General transfer comment: pt needs UE support and attempts to avoid looking down to prevent dizziness.    Ambulation/Gait Ambulation/Gait assistance: Supervision;Min guard Ambulation Distance (Feet): 400 Feet Assistive device: None Gait Pattern/deviations: Step-through pattern;Decreased stride length     General Gait Details: pt moves very slowly and very guarded keeping head rigid in an attempt to avoid feeling dizzy.  pt mildly unsteady and needs occasional UE support when presented with balance challenges of head turns, negotiating obstacles, and performing stairs.  pt has difficulty using signage to find rooms on unit.     Stairs Stairs: Yes Stairs assistance: Min guard Stair Management: One rail Left;Step to pattern;Forwards Number of Stairs: 11 General stair comments: pt needs use of railing and attempts to avoid looking down at stairs 2/2 dizziness.    Wheelchair Mobility    Modified Rankin (Stroke Patients Only) Modified Rankin (Stroke Patients Only) Pre-Morbid Rankin Score: No symptoms Modified Rankin: Moderately severe disability     Balance Overall balance assessment: Needs assistance Sitting-balance support: No upper extremity supported;Feet supported Sitting balance-Leahy Scale: Good     Standing balance support: Single extremity supported;During functional activity Standing balance-Leahy Scale: Poor Standing balance comment: pt needs UE support when leaning anteriorly to perform peri hygiene due to dizziness.  Cognition Arousal/Alertness: Awake/alert Behavior During Therapy: WFL for tasks assessed/performed Overall Cognitive Status: Impaired/Different from baseline Area of Impairment: Memory;Safety/judgement;Awareness;Problem solving     Memory: Decreased short-term memory   Safety/Judgement: Decreased awareness of  safety;Decreased awareness of deficits Awareness: Anticipatory Problem Solving:  (Difficulty multitasking- cognitive problems while ambulating) General Comments: pt with difficulty today performing cognitive tasks while ambulating and needs A to use signage in hallway to find rooms.      Exercises      General Comments        Pertinent Vitals/Pain Pain Assessment: No/denies pain    Home Living                      Prior Function            PT Goals (current goals can now be found in the care plan section) Acute Rehab PT Goals Patient Stated Goal: get back to being independent PT Goal Formulation: With patient Time For Goal Achievement: 09/11/14 Potential to Achieve Goals: Good Progress towards PT goals: Progressing toward goals    Frequency  Min 3X/week    PT Plan Current plan remains appropriate    Co-evaluation             End of Session Equipment Utilized During Treatment: Gait belt Activity Tolerance: Patient tolerated treatment well Patient left: in bed;with call bell/phone within reach     Time: 0836-0916 PT Time Calculation (min) (ACUTE ONLY): 40 min  Charges:  $Gait Training: 23-37 mins $Therapeutic Activity: 8-22 mins                    G CodesCatarina Hartshorn, [place] 09/01/2014, 10:08 AM

## 2014-09-01 NOTE — Discharge Instructions (Signed)
Clopidogrel tablets What is this medicine? CLOPIDOGREL (kloh PID oh grel) helps to prevent blood clots. This medicine is used to prevent heart attack, stroke, or other vascular events in people who are at high risk. This medicine may be used for other purposes; ask your health care provider or pharmacist if you have questions. COMMON BRAND NAME(S): Plavix What should I tell my health care provider before I take this medicine? They need to know if you have any of the following conditions: -bleeding disorder -bleeding in the brain -planned surgery -stomach or intestinal ulcers -stroke or transient ischemic attack -an unusual or allergic reaction to clopidogrel, other medicines, foods, dyes, or preservatives -pregnant or trying to get pregnant -breast-feeding How should I use this medicine? Take this medicine by mouth with a drink of water. Follow the directions on the prescription label. You may take this medicine with or without food. Take your medicine at regular intervals. Do not take your medicine more often than directed. Talk to your pediatrician regarding the use of this medicine in children. Special care may be needed. Overdosage: If you think you have taken too much of this medicine contact a poison control center or emergency room at once. NOTE: This medicine is only for you. Do not share this medicine with others. What if I miss a dose? If you miss a dose, take it as soon as you can. If it is almost time for your next dose, take only that dose. Do not take double or extra doses. What may interact with this medicine? -aspirin -blood thinners like cilostazol, enoxaparin, ticlopidine, and warfarin -certain medicines for depression like citalopram, fluoxetine, and fluvoxamine -certain medicines for fungal infections like ketoconazole, fluconazole, and voriconazole -certain medicines for HIV infection like delavirdine, efavirenz, and etravirine -certain medicines for seizures like  felbamate, oxcarbazepine, and phenytoin -chloramphenicol -fluvastatin -isoniazid, INH -medicines for inflammation like ibuprofen and naproxen -modafinil -nicardipine -over-the counter supplements like echinacea, feverfew, fish oil, garlic, ginger, ginkgo, green tea, horse chestnut -quinine -stomach acid blockers like cimetidine, omeprazole, and esomeprazole -tamoxifen -tolbutamide -topiramate -torsemide This list may not describe all possible interactions. Give your health care provider a list of all the medicines, herbs, non-prescription drugs, or dietary supplements you use. Also tell them if you smoke, drink alcohol, or use illegal drugs. Some items may interact with your medicine. What should I watch for while using this medicine? Visit your doctor or health care professional for regular check ups. Do not stop taking your medicine unless your doctor tells you to. Notify your doctor or health care professional and seek emergency treatment if you develop breathing problems; changes in vision; chest pain; severe, sudden headache; pain, swelling, warmth in the leg; trouble speaking; sudden numbness or weakness of the face, arm or leg. These can be signs that your condition has gotten worse. If you are going to have surgery or dental work, tell your doctor or health care professional that you are taking this medicine. Certain genetic factors may reduce the effect of this medicine. Your doctor may use genetic tests to determine treatment. What side effects may I notice from receiving this medicine? Side effects that you should report to your doctor or health care professional as soon as possible: -allergic reactions like skin rash, itching or hives, swelling of the face, lips, or tongue -breathing problems -changes in vision -fever -signs and symptoms of bleeding such as bloody or black, tarry stools; red or dark-brown urine; spitting up blood or brown material that looks like coffee  grounds;  red spots on the skin; unusual bruising or bleeding from the eye, gums, or nose -sudden weakness -unusual bleeding or bruising Side effects that usually do not require medical attention (report to your doctor or health care professional if they continue or are bothersome): -constipation or diarrhea -headache -pain in back or joints -stomach upset This list may not describe all possible side effects. Call your doctor for medical advice about side effects. You may report side effects to FDA at 1-800-FDA-1088. Where should I keep my medicine? Keep out of the reach of children. Store at room temperature of 59 to 86 degrees F (15 to 30 degrees C). Throw away any unused medicine after the expiration date. NOTE: This sheet is a summary. It may not cover all possible information. If you have questions about this medicine, talk to your doctor, pharmacist, or health care provider.  2015, Elsevier/Gold Standard. (2012-08-06 16:34:37) Stroke Prevention Some health problems and behaviors may make it more likely for you to have a stroke. Below are ways to lessen your risk of having a stroke.   Be active for at least 30 minutes on most or all days.  Do not smoke. Try not to be around others who smoke.  Do not drink too much alcohol.  Do not have more than 2 drinks a day if you are a man.  Do not have more than 1 drink a day if you are a woman and are not pregnant.  Eat healthy foods, such as fruits and vegetables. If you were put on a specific diet, follow the diet as told.  Keep your cholesterol levels under control through diet and medicines. Look for foods that are low in saturated fat, trans fat, cholesterol, and are high in fiber.  If you have diabetes, follow all diet plans and take your medicine as told.  If you have high blood pressure (hypertension), follow all diet plans and take your medicine as told.  Keep a healthy weight. Eat foods that are low in calories, salt, saturated fat,  trans fat, and cholesterol.  Do not take drugs.  Avoid birth control pills, if this applies. Talk to your doctor about the risks of taking birth control pills.  Talk to your doctor if you have sleep problems (sleep apnea).  Take all medicine as told by your doctor.  You may be told to take aspirin or blood thinner medicine. Take this medicine as told by your doctor.  Understand your medicine instructions.  Make sure any other conditions you have are being taken care of. GET HELP RIGHT AWAY IF:  You suddenly lose feeling (you feel numb) or have weakness in your face, arm, or leg.  Your face or eyelid hangs down to one side.  You suddenly feel confused.  You have trouble talking (aphasia) or understanding what people are saying.  You suddenly have trouble seeing in one or both eyes.  You suddenly have trouble walking.  You are dizzy.  You lose your balance or your movements are clumsy (uncoordinated).  You suddenly have a very bad headache and you do not know the cause.  You have new chest pain.  Your heart feels like it is fluttering or skipping a beat (irregular heartbeat). Do not wait to see if the symptoms above go away. Get help right away. Call your local emergency services (911 in [place] Do not drive yourself to the hospital. Document Released: 10/10/2011 Document Revised: 08/25/2013 Document Reviewed: 10/11/2012 [facility] Patient Information 2015 [place], [place].  This information is not intended to replace advice given to you by your health care provider. Make sure you discuss any questions you have with your health care provider. Ischemic Stroke A stroke (cerebrovascular accident) is the sudden death of brain tissue. It is a medical emergency. A stroke can cause permanent loss of brain function. This can cause problems with different parts of your body. A transient ischemic attack (TIA) is different because it does not cause permanent damage. A TIA is a short-lived  problem of poor blood flow affecting a part of the brain. A TIA is also a serious problem because having a TIA greatly increases the chances of having a stroke. When symptoms first develop, you cannot know if the problem might be a stroke or a TIA. CAUSES  A stroke is caused by a decrease of oxygen supply to an area of your brain. It is usually the result of a small blood clot or collection of cholesterol or fat (plaque) that blocks blood flow in the brain. A stroke can also be caused by blocked or damaged carotid arteries.  RISK FACTORS  High blood pressure (hypertension).  High cholesterol.  Diabetes mellitus.  Heart disease.  The buildup of plaque in the blood vessels (peripheral artery disease or atherosclerosis).  The buildup of plaque in the blood vessels providing blood and oxygen to the brain (carotid artery stenosis).  An abnormal heart rhythm (atrial fibrillation).  Obesity.  Smoking.  Taking oral contraceptives (especially in combination with smoking).  Physical inactivity.  A diet high in fats, salt (sodium), and calories.  Alcohol use.  Use of illegal drugs (especially cocaine and methamphetamine).  Being African American.  Being over the age of 22.  Family history of stroke.  Previous history of blood clots, stroke, TIA, or heart attack.  Sickle cell disease. SYMPTOMS  These symptoms usually develop suddenly, or may be newly present upon awakening from sleep:  Sudden weakness or numbness of the face, arm, or leg, especially on one side of the body.  Sudden trouble walking or difficulty moving arms or legs.  Sudden confusion.  Sudden personality changes.  Trouble speaking (aphasia) or understanding.  Difficulty swallowing.  Sudden trouble seeing in one or both eyes.  Double vision.  Dizziness.  Loss of balance or coordination.  Sudden severe headache with no known cause.  Trouble reading or writing. DIAGNOSIS  Your health care provider  can often determine the presence or absence of a stroke based on your symptoms, history, and physical exam. Computed tomography (CT) of the brain is usually performed to confirm the stroke, determine causes, and determine stroke severity. Other tests may be done to find the cause of the stroke. These tests may include:  Electrocardiography.  Continuous heart monitoring.  Echocardiography.  Carotid ultrasonography.  Magnetic resonance imaging (MRI).  A scan of the brain circulation.  Blood tests. PREVENTION  The risk of a stroke can be decreased by appropriately treating high blood pressure, high cholesterol, diabetes, heart disease, and obesity and by quitting smoking, limiting alcohol, and staying physically active. TREATMENT  Time is of the essence. It is important to seek treatment at the first sign of these symptoms because you may receive a medicine to dissolve the clot (thrombolytic) that cannot be given if too much time has passed since your symptoms began. Even if you do not know when your symptoms began, get treatment as soon as possible as there are other treatment options available including oxygen, intravenous (IV) fluids, and medicines  to thin the blood (anticoagulants). Treatment of stroke depends on the duration, severity, and cause of your symptoms. Medicines and dietary changes may be used to address diabetes, high blood pressure, and other risk factors. Physical, speech, and occupational therapists will assess you and work with you to improve any functions impaired by the stroke. Measures will be taken to prevent short-term and long-term complications, including infection from breathing foreign material into the lungs (aspiration pneumonia), blood clots in the legs, bedsores, and falls. Rarely, surgery may be needed to remove large blood clots or to open up blocked arteries. HOME CARE INSTRUCTIONS   Take medicines only as directed by your health care provider. Follow the  directions carefully. Medicines may be used to control risk factors for a stroke. Be sure you understand all your medicine instructions.  You may be told to take a medicine to thin the blood, such as aspirin or the anticoagulant warfarin. Warfarin needs to be taken exactly as instructed.  Too much and too little warfarin are both dangerous. Too much warfarin increases the risk of bleeding. Too little warfarin continues to allow the risk for blood clots. While taking warfarin, you will need to have regular blood tests to measure your blood clotting time. These blood tests usually include both the PT and INR tests. The PT and INR results allow your health care provider to adjust your dose of warfarin. The dose can change for many reasons. It is critically important that you take warfarin exactly as prescribed, and that you have your PT and INR levels drawn exactly as directed.  Many foods, especially foods high in vitamin K, can interfere with warfarin and affect the PT and INR results. Foods high in vitamin K include spinach, kale, broccoli, cabbage, collard and turnip greens, brussels sprouts, peas, cauliflower, seaweed, and parsley, as well as beef and pork liver, green tea, and soybean oil. You should eat a consistent amount of foods high in vitamin K. Avoid major changes in your diet, or notify your health care provider before changing your diet. Arrange a visit with a dietitian to answer your questions.  Many medicines can interfere with warfarin and affect the PT and INR results. You must tell your health care provider about any and all medicines you take. This includes all vitamins and supplements. Be especially cautious with aspirin and anti-inflammatory medicines. Do not take or discontinue any prescribed or over-the-counter medicine except on the advice of your health care provider or pharmacist.  Warfarin can have side effects, such as excessive bruising or bleeding. You will need to hold  pressure over cuts for longer than usual. Your health care provider or pharmacist will discuss other potential side effects.  Avoid sports or activities that may cause injury or bleeding.  Be mindful when shaving, flossing your teeth, or handling sharp objects.  Alcohol can change the body's ability to handle warfarin. It is best to avoid alcoholic drinks or consume only very small amounts while taking warfarin. Notify your health care provider if you change your alcohol intake.  Notify your dentist or other health care providers before procedures.  If swallow studies have determined that your swallowing reflex is present, you should eat healthy foods. Including 5 or more servings of fruits and vegetables a day may reduce the risk of stroke. Foods may need to be a certain consistency (soft or pureed), or small bites may need to be taken in order to avoid aspirating or choking. Certain dietary changes may be advised to  address high blood pressure, high cholesterol, diabetes, or obesity.  Food choices that are low in sodium, saturated fat, trans fat, and cholesterol are recommended to manage high blood pressure.  Food choies that are high in fiber, and low in saturated fat, trans fat, and cholesterol may control cholesterol levels.  Controlling carbohydrates and sugar intake is recommended to manage diabetes.  Reducing calorie intake and making food choices that are low in sodium, saturated fat, trans fat, and cholesterol are recommended to manage obesity.  Maintain a healthy weight.  Stay physically active. It is recommended that you get at least 30 minutes of activity on all or most days.  Do not use any tobacco products including cigarettes, chewing tobacco, or electronic cigarettes.  Limit alcohol use even if you are not taking warfarin. Moderate alcohol use is considered to be:  No more than 2 drinks each day for men.  No more than 1 drink each day for nonpregnant women.  Home  safety. A safe home environment is important to reduce the risk of falls. Your health care provider may arrange for specialists to evaluate your home. Having grab bars in the bedroom and bathroom is often important. Your health care provider may arrange for equipment to be used at home, such as raised toilets and a seat for the shower.  Physical, occupational, and speech therapy. Ongoing therapy may be needed to maximize your recovery after a stroke. If you have been advised to use a walker or a cane, use it at all times. Be sure to keep your therapy appointments.  Follow all instructions for follow-up with your health care provider. This is very important. This includes any referrals, physical therapy, rehabilitation, and lab tests. Proper follow-up can prevent another stroke from occurring. SEEK MEDICAL CARE IF:  You have personality changes.  You have difficulty swallowing.  You are seeing double.  You have dizziness.  You have a fever.  You have skin breakdown. SEEK IMMEDIATE MEDICAL CARE IF:  Any of these symptoms may represent a serious problem that is an emergency. Do not wait to see if the symptoms will go away. Get medical help right away. Call your local emergency services (911 in [place] Do not drive yourself to the hospital.  You have sudden weakness or numbness of the face, arm, or leg, especially on one side of the body.  You have sudden trouble walking or difficulty moving arms or legs.  You have sudden confusion.  You have trouble speaking (aphasia) or understanding.  You have sudden trouble seeing in one or both eyes.  You have a loss of balance or coordination.  You have a sudden, severe headache with no known cause.  You have new chest pain or an irregular heartbeat.  You have a partial or total loss of consciousness. Document Released: 04/10/2005 Document Revised: 08/25/2013 Document Reviewed: 11/19/2011 [facility] Patient Information 2015 [place], [place]. This  information is not intended to replace advice given to you by your health care provider. Make sure you discuss any questions you have with your health care provider.

## 2014-09-01 NOTE — Progress Notes (Signed)
Pt d/c to home by car with family. Assessment stable. Pt verbalizes understanding of d/c instructions. 

## 2014-09-01 NOTE — Care Management Note (Signed)
Case Management Note  Patient Details  Name: Holly [name] MRN: [id number] Date of Birth: 1936-01-10  Subjective/Objective:      Admitted with Stroke              Action/Plan: CM talked to patient and daughter Holly [name] ( via telephone) about [place] choices, Holly [name] chose [place] for home care services, [facility] agency called for arrangements; Holly [name] plans to stay with the patient for a week to assist in her care;   Expected Discharge Date:       09/01/2014           Expected Discharge Plan:  [place]  In-House Referral:  Clinical Social Work  Discharge planning Services  CM Consult  Post Acute Care Choice:    Choice offered to:  Patient /daughter Holly [name]  HH Arranged:  RN, PT, OT [facility] Agency:  [place]  Status of Service:   completed  Medicare Important Message Given:  Yes Date Medicare IM Given:  08/31/14 Medicare IM give by:  Lorne Skeens RN, MSN, [place], [place], RN,BSN,MHA [id number] 09/01/2014, 11:41 AM

## 2014-09-09 NOTE — Patient Outreach (Signed)
[facility]) Care Management  09/09/2014  Holly [name] 03-Aug-1935 [id number]   Patient triggered RED on [name] Stroke Dashboard from 09/08/2014 call.  Assigned Sherrin Daisy, RN to follow up with patient.  [name]. [name], [name] Management [place] Assistant Phone: [phone] Fax: [phone]

## 2014-09-10 ENCOUNTER — Other Ambulatory Visit: Payer: Self-pay | Admitting: *Deleted

## 2014-09-10 NOTE — Patient Outreach (Signed)
[facility] Care Management  09/10/2014  Holly [name] 1936/01/06 [id number]   [name]-Stroke referral:  Telephone call to patient; left message on voice mail requesting call back. Plan: Will follow up.  Sherrin Daisy, RN BSN [place] Management Coordinator [facility] Care Management  [phone]

## 2014-09-11 ENCOUNTER — Encounter: Payer: Self-pay | Admitting: *Deleted

## 2014-09-11 ENCOUNTER — Other Ambulatory Visit: Payer: Self-pay | Admitting: *Deleted

## 2014-09-11 NOTE — Patient Outreach (Signed)
[place] [facility]) Care Management  09/11/2014  GEOFFREY MANKIN [birth year] [id number]   [name] Stroke transition referral:  Telephone outreach to patient who voices that she was recently admitted to [facility] with symptoms of severe vertigo, nausea, vomiting, weakness and inability to stand.  States currently home with husband. States she family support and  daughter is helping her while she recuperates.  Patient states she received discharge instructions and voices understanding.   Voices that she has been able to obtain all new prescriptions and is taking all of her medications as prescribed by her doctors. New medications include aspirin, Plavix, and Lipitor.   Currently restricted to driving only short distances. States daughter is taking her to doctor's appointments and most recent follow up appointment was with primary care on 09/07/2014.   States home health services started with physical therapy and registered nurse. States she is able to currently walk without problems. States walker was ordered while she was in hospital but has not been delivered yet. States she does experience some weakness when standing for long period of time and would like walker for security until she gets her strength back. (This RN care coordinator will follow up and advise patient.)  Stroke symptoms reviewed with patient and she voices that she has not had any repeat symptoms that required hospitalization and no new symptoms since recent discharge. Voices that she understands action plan if symptoms occur.   Plan: will follow up with patient next week and continue to follow noted care plan. Patient agrees with telephonic appointment set for next week  Sherrin Daisy, RN BSN [place] Management Coordinator [facility] Care Management  [phone].

## 2014-09-17 ENCOUNTER — Other Ambulatory Visit: Payer: Self-pay | Admitting: *Deleted

## 2014-09-17 NOTE — Patient Outreach (Signed)
[facility]) Care Management  09/17/2014  Holly [name] [birth year] [id number]   Follow up call #2 to patient enrolled in [facility] program. Patient alert and responding appropriately with clear speech. States she has not had any stroke symptoms since we spoke last. No hospital admissions or emergency department visits since our last contact. She is able to voice symptoms of stroke and knows action plan to apply if they occur.   Patient states she continues to take medications as prescribed and there are no changes with medications. States no problems with new medications-Plavix and Lipitor.   Patient voices that she has follow up appointment with neurology on July 25th but wishes to get an earlier appointment. Patient plans to call to try to get earlier appointment.  Continues to receive home health services of RN. States home physical therapy sessions are complete.  Health assessments done and  care plan updated as noted.  Plan: will follow up next week . Patient agrees with telephonic appointment that has been set up.   Sherrin Daisy, RN BSN [place] Management Coordinator [facility] Care Management  [phone]

## 2014-09-24 ENCOUNTER — Other Ambulatory Visit: Payer: Self-pay | Admitting: *Deleted

## 2014-09-24 ENCOUNTER — Encounter: Payer: Self-pay | Admitting: *Deleted

## 2014-09-24 NOTE — Patient Outreach (Signed)
[place] [facility]) Care Management  09/24/2014  Holly [name] 1936-04-06 [id number]  [name]-stroke referral: Follow up call; patient alert and responding appropriately. States she has had no stroke symptoms and was able to voice that she would call 911 or have family member call 911 if she experienced symptoms of slurred speech, inability to speak, sudden numbness or weakness of face , arm, or leg; trouble walking, severe dizziness.  States she was able to get earlier appointment with neurologist for July 14th. States her walker was delivered this week.s week after making inquiry to hospital. States she is moving about carefully and has had no falls since hospital discharge.  Continues to take medications as directed by doctor.  Continues to have home health services of RN. States RN is teaching her how to take her blood pressure. Plan: Will follow up with patient. Will continue to follow care plan as noted.  Patient agrees with telephonic appointment set up.    Sherrin Daisy, RN BSN [place] Management Coordinator [facility] Care Management  [phone]

## 2014-10-01 ENCOUNTER — Other Ambulatory Visit: Payer: Self-pay | Admitting: *Deleted

## 2014-10-01 NOTE — Patient Outreach (Signed)
[place] [facility]) Care Management  10/01/2014  Holly [name] 1936-02-15 [id number]   Follow up call to patient. Patient voices only can talk briefly. States she has had no signs of stroke. No admissions to hospital or emergency room. States husband had recent surgery and she is caring for him.   Plan: will call back next week at patient's request. Care plan will be followed as noted.  Will send Advance Care Planning educational packet as requested by patient. Patient agrees to next appointment time. Sherrin Daisy, RN BSN [place] Management Coordinator [facility] Care Management  [phone]

## 2014-10-02 ENCOUNTER — Encounter: Payer: Self-pay | Admitting: *Deleted

## 2014-10-05 ENCOUNTER — Ambulatory Visit: Payer: Medicare Other | Admitting: *Deleted

## 2014-10-06 ENCOUNTER — Other Ambulatory Visit: Payer: Self-pay | Admitting: *Deleted

## 2014-10-06 NOTE — Patient Outreach (Signed)
[place] [facility]) Care Management  10/06/2014  Holly [name] [birth year] [id number]   [name]-Stroke follow up call; left message on voice mail requesting call back.  Plan: will follow up. Sherrin Daisy, RN BSN [place] Management Coordinator [facility] Care Management  [phone]

## 2014-10-13 ENCOUNTER — Other Ambulatory Visit: Payer: Self-pay | Admitting: *Deleted

## 2014-10-13 NOTE — Patient Outreach (Signed)
[place] [facility]) Care Management  10/13/2014  AARIKA MOON 1936-01-27 [id number]   [name]-Stroke follow up: Telephone call to patient who voices that she has not had any stroke symptoms since our last contact. No emergency room visits or hospital admission since recent admission for stroke symptoms in May 2016.  States home services of RN have been completed. Uses rolling walker if needed. States no change in medications and adhering to taking medications as prescribed by her doctors.   Patient has support from her daughter who will be taking her to neurology appointment in July.   Plan: will follow up with patient next week. Patient agrees to set appointment.

## 2014-10-19 ENCOUNTER — Ambulatory Visit: Payer: Medicare Other | Admitting: *Deleted

## 2014-10-21 ENCOUNTER — Other Ambulatory Visit: Payer: Self-pay | Admitting: *Deleted

## 2014-10-21 NOTE — Patient Outreach (Signed)
[place] [facility]) Care Management  10/21/2014  Holly [name] 08-17-1935 [id number]  [name]-stroke telephone call to patient. Patient voices that she is feeling well today. Had  2nd MD follow up visit yesterday. Follow appointment with neurologist has been scheduled . States she is compliant with taking medications as prescribed by her doctors.   No emergency room visits or hospital admissions since our last contact.  Patient voices knowledge of stroke symptoms and knows action plan.  Care plan goals have been met.   Plan: case closure . MD closure letter to be sent.   Sherrin Daisy, RN BSN [place] Management Coordinator [facility] Care Management  [phone]

## 2014-10-27 ENCOUNTER — Encounter: Payer: Self-pay | Admitting: *Deleted

## 2014-11-05 ENCOUNTER — Ambulatory Visit (INDEPENDENT_AMBULATORY_CARE_PROVIDER_SITE_OTHER): Payer: Medicare Other | Admitting: Neurology

## 2014-11-05 ENCOUNTER — Encounter: Payer: Self-pay | Admitting: Neurology

## 2014-11-05 DIAGNOSIS — I63219 Cerebral infarction due to unspecified occlusion or stenosis of unspecified vertebral arteries: Secondary | ICD-10-CM

## 2014-11-05 DIAGNOSIS — I639 Cerebral infarction, unspecified: Secondary | ICD-10-CM | POA: Diagnosis not present

## 2014-11-05 MED ORDER — CLOPIDOGREL BISULFATE 75 MG PO TABS: 75.0000 mg | ORAL_TABLET | Freq: Every day | ORAL | Status: DC

## 2014-11-05 MED ORDER — ATORVASTATIN CALCIUM 40 MG PO TABS
40.0000 mg | ORAL_TABLET | Freq: Every day | ORAL | Status: DC
Start: 2014-11-05 — End: 2016-02-04

## 2014-11-05 NOTE — Progress Notes (Addendum)
PATIENT: Holly [name] DOB: July 16, 1935  Chief Complaint  Patient presents with  . Cerebrovascular Accident    MMSE 29/30 - 7 animals.  She is here with her daughter,Holly [name], for a hospital follow up visit from her stroke in May 2016.  She has completed her home physical therapy program.    HISTORICAL  Holly [name] is a 79 year old right-handed female, accompanied by her daughter Holly [name], seen in refer by her primary care physician Dr. Fulton Reek for stroke.  She had a history of hypertension, hyperlipidemia, diabetes, right Bell's palsy, with residual right upper and lower face weakness, synergistic movement of right facial muscles.  In Aug 27 2014, she presented with acute onset dizziness, nausea, ataxia, was admitted to stroke service. I have reviewed the chart, and personally reviewed MRI films.  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 CT angiogram of head and neck, left vertebral artery occlusion Echocardiogram unremarkable Laboratory evaluations, LDL 159, A1c 5.8  During hospital stay, she also has mild elevated troponin to 0.24, Bradycardia/heart block, resolved, cardiology was consult, likely newly mediated/vasovagal due to associated nausea and vomiting.  REVIEW OF SYSTEMS: Full 14 system review of systems performed and notable only for blurred vision, loss of vision, weakness, dizziness, anxiety, not enough sleep, running nose  ALLERGIES: No Known Allergies  HOME MEDICATIONS: Current Outpatient Prescriptions  Medication Sig Dispense Refill  . aspirin 81 MG chewable tablet Chew 1 tablet (81 mg total) by mouth daily. 30 tablet 2  . atorvastatin (LIPITOR) 40 MG tablet Take 1 tablet (40 mg total) by mouth daily at 6 PM. 30 tablet 2  . BIOTIN 5000 PO Take by mouth.    . clopidogrel (PLAVIX) 75 MG tablet Take 1 tablet (75 mg total) by mouth daily. 30 tablet 2  . Coenzyme Q10 (CO Q 10) 10 MG CAPS Take 10 mg by mouth daily.    Marland Kitchen losartan  (COZAAR) 25 MG tablet Take 25 mg by mouth daily.    . metFORMIN (GLUCOPHAGE) 500 MG tablet Take 500 mg by mouth 2 (two) times daily with a meal.    . metoprolol (LOPRESSOR) 50 MG tablet Take 50 mg by mouth. Take one tablet once daily/patient reported    . Red Yeast Rice Extract (RED YEAST RICE PO) Take by mouth.    . vitamin C (ASCORBIC ACID) 500 MG tablet Take 500 mg by mouth daily.     No current facility-administered medications for this visit.    PAST MEDICAL HISTORY: Past Medical History  Diagnosis Date  . Vertigo   . Hypertension   . Diabetes mellitus without complication     non insulin dependent  . Bell's palsy   . CVA (cerebral infarction)     PAST SURGICAL HISTORY: Past Surgical History  Procedure Laterality Date  . Breast surgery      Benign tumor  . Tonsillectomy and adenoidectomy      FAMILY HISTORY: Family History  Problem Relation Age of Onset  . Cancer Mother     Marena Chancy of type - "female"  . Diabetes Father   . Lung cancer Brother     SOCIAL HISTORY:  History   Social History  . Marital Status: Married    Spouse Name: N/A  . Number of Children: 1  . Years of Education: 12   Occupational History  . Retired    Social History Main Topics  . Smoking status: Never Smoker   . Smokeless tobacco: Not  on file  . Alcohol Use: No  . Drug Use: No  . Sexual Activity: Not on file   Other Topics Concern  . Not on file   Social History Narrative   Lives at home with husband.   Right-handed.   1-2 cups of caffeine per day.     PHYSICAL EXAM   Filed Vitals:   11/05/14 1435  BP: 176/80  Pulse: 63  Height: 5\' 7"  (1.702 m)  Weight: 156 lb (70.761 kg)    Not recorded      Body mass index is 24.43 kg/(m^2).  PHYSICAL EXAMNIATION:  Gen: NAD, conversant, well nourised, obese, well groomed                     Cardiovascular: Regular rate rhythm, no peripheral edema, warm, nontender. Eyes: Conjunctivae clear without exudates or  hemorrhage Neck: Supple, no carotid bruise. Pulmonary: Clear to auscultation bilaterally   NEUROLOGICAL EXAM:  MENTAL STATUS: Speech:    Speech is normal; fluent and spontaneous with normal comprehension.  Cognition:    The patient is oriented to person, place, and time;     Mini-Mental Status exam is 29 out of 30, she missed one out of 3 recalls    language fluent;     normal attention, concentration,     fund of knowledge.  CRANIAL NERVES: CN II: Visual fields are full to confrontation. Fundoscopic exam is normal with sharp discs were equal round reactive to light. CN III, IV, VI: extraocular movement are normal. No ptosis. CN V: Facial sensation is intact to pinprick in all 3 divisions bilaterally. Corneal responses are intact.  CN VII: Right upper and lower face weakness, synergistic movement, tearing from right eye CN VIII: Hearing is normal to rubbing fingers CN IX, X: Palate elevates symmetrically. Phonation is normal. CN XI: Head turning and shoulder shrug are intact CN XII: Tongue is midline with normal movements and no atrophy.  MOTOR: There is no pronator drift of out-stretched arms. Muscle bulk and tone are normal. Muscle strength is normal.  REFLEXES: Reflexes are 2+ and symmetric at the biceps, triceps, knees, and ankles. Plantar responses are flexor.  SENSORY: Light touch, pinprick, position sense, and vibration sense are intact in fingers and toes.  COORDINATION: Rapid alternating movements and fine finger movements are intact. There is no dysmetria on finger-to-nose and heel-knee-shin. There are no abnormal or extraneous movements.   GAIT/STANCE: Posture is normal. Gait is steady with normal steps, base, arm swing, and turning. Heel and toe walking are normal. Tandem gait is normal.  Romberg is absent.   DIAGNOSTIC DATA (LABS, IMAGING, TESTING) - I reviewed patient records, labs, notes, testing and imaging myself where available.  Lab Results  Component  Value Date   WBC 8.0 08/31/2014   HGB 12.3 08/31/2014   HCT 37.5 08/31/2014   MCV 89.1 08/31/2014   PLT 210 08/31/2014      Component Value Date/Time   NA 137 08/31/2014 0555   K 3.4* 08/31/2014 0555   CL 105 08/31/2014 0555   CO2 24 08/31/2014 0555   GLUCOSE 147* 08/31/2014 0555   BUN 21* 08/31/2014 0555   CREATININE 0.68 08/31/2014 0555   CALCIUM 8.3* 08/31/2014 0555   PROT 6.6 08/27/2014 1020   ALBUMIN 3.9 08/27/2014 1020   AST 19 08/27/2014 1020   ALT 14 08/27/2014 1020   ALKPHOS 54 08/27/2014 1020   BILITOT 1.0 08/27/2014 1020   GFRNONAA >60 08/31/2014 0555   GFRAA >60  08/31/2014 0555   Lab Results  Component Value Date   CHOL 232* 08/28/2014   HDL 54 08/28/2014   LDLCALC 159* 08/28/2014   TRIG 97 08/28/2014   CHOLHDL 4.3 08/28/2014   Lab Results  Component Value Date   HGBA1C 6.8* 08/28/2014   No results found for: VITAMINB12 No results found for: TSH    ASSESSMENT AND PLAN  TAMICA COVELL is a 79 y.o. 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 Ph.D.  [facility] [address] [place], [place] [address] Ph: [phone] Fax: [phone]

## 2014-11-06 DIAGNOSIS — I6340 Cerebral infarction due to embolism of unspecified cerebral artery: Secondary | ICD-10-CM | POA: Insufficient documentation

## 2014-11-16 ENCOUNTER — Ambulatory Visit: Payer: Medicare Other | Admitting: Neurology

## 2015-01-18 ENCOUNTER — Encounter: Payer: Self-pay | Admitting: Podiatry

## 2015-01-18 ENCOUNTER — Ambulatory Visit (INDEPENDENT_AMBULATORY_CARE_PROVIDER_SITE_OTHER): Payer: Medicare Other | Admitting: Podiatry

## 2015-01-18 VITALS — BP 192/86 | HR 71 | Resp 16

## 2015-01-18 DIAGNOSIS — E1142 Type 2 diabetes mellitus with diabetic polyneuropathy: Secondary | ICD-10-CM

## 2015-01-18 DIAGNOSIS — Q828 Other specified congenital malformations of skin: Secondary | ICD-10-CM

## 2015-01-18 DIAGNOSIS — M79676 Pain in unspecified toe(s): Secondary | ICD-10-CM

## 2015-01-18 DIAGNOSIS — B351 Tinea unguium: Secondary | ICD-10-CM | POA: Diagnosis not present

## 2015-01-18 NOTE — Progress Notes (Signed)
She presents today with a chief complaint of painful elongated toenails as well as porokeratosis plantar aspect of the bilateral foot. She states that these been bothering her for quite some time and is starting to affect her ability to walk normally.  Objective: Vital signs are stable she is alert and oriented 3. Pulses are palpable. She is in no acute distress. Neurologic sensorium is intact for Semmes-Weinstein monofilament. Deep tendon reflexes are intact bilateral and muscle strength is 5 over 5 dorsiflexion and plantar flexors and inverters everters onto the musculature is intact. Orthopedic evaluation was resolved with cystlike while full range of motion without crepitation. Cutaneous evaluation shows supple well-hydrated cutis no erythema edema saline strange odor solitary porokeratosis subsecond metatarsophalangeal joint bilateral. Toenails are thick yellow dystrophic with mycotic and painful.  Assessment: Pain in limb secondary to onychomycosis and porokeratosis bilateral. Diabetes mellitus with mild diabetic peripheral neuropathy  Plan: Debridement of all reactive hyperkeratotic tissue as well as nails 1 through 5 bilateral covered service secondary to pain.  Roselind Messier DPM

## 2015-04-08 ENCOUNTER — Other Ambulatory Visit: Payer: Self-pay | Admitting: Internal Medicine

## 2015-04-08 DIAGNOSIS — Z1231 Encounter for screening mammogram for malignant neoplasm of breast: Secondary | ICD-10-CM

## 2015-04-14 ENCOUNTER — Ambulatory Visit
Admission: RE | Admit: 2015-04-14 | Discharge: 2015-04-14 | Disposition: A | Discharge status: Home / Self Care | Payer: Medicare Other | Source: Ambulatory Visit | Attending: Internal Medicine | Admitting: Internal Medicine

## 2015-04-14 DIAGNOSIS — Z1231 Encounter for screening mammogram for malignant neoplasm of breast: Secondary | ICD-10-CM | POA: Diagnosis present

## 2015-05-10 ENCOUNTER — Ambulatory Visit (INDEPENDENT_AMBULATORY_CARE_PROVIDER_SITE_OTHER): Payer: Medicare HMO | Admitting: Nurse Practitioner

## 2015-05-10 ENCOUNTER — Encounter: Payer: Self-pay | Admitting: Nurse Practitioner

## 2015-05-10 VITALS — BP 150/80 | HR 60 | Ht 67.0 in | Wt 151.4 lb

## 2015-05-10 DIAGNOSIS — I7774 Dissection of vertebral artery: Secondary | ICD-10-CM

## 2015-05-10 DIAGNOSIS — G510 Bell's palsy: Secondary | ICD-10-CM | POA: Insufficient documentation

## 2015-05-10 DIAGNOSIS — E785 Hyperlipidemia, unspecified: Secondary | ICD-10-CM | POA: Diagnosis not present

## 2015-05-10 DIAGNOSIS — I10 Essential (primary) hypertension: Secondary | ICD-10-CM

## 2015-05-10 DIAGNOSIS — I63112 Cerebral infarction due to embolism of left vertebral artery: Secondary | ICD-10-CM | POA: Diagnosis not present

## 2015-05-10 DIAGNOSIS — I6330 Cerebral infarction due to thrombosis of unspecified cerebral artery: Secondary | ICD-10-CM

## 2015-05-10 DIAGNOSIS — H25019 Cortical age-related cataract, unspecified eye: Secondary | ICD-10-CM | POA: Insufficient documentation

## 2015-05-10 MED ORDER — CLOPIDOGREL BISULFATE 75 MG PO TABS: 75.0000 mg | ORAL_TABLET | Freq: Every day | ORAL | Status: DC

## 2015-05-10 NOTE — Progress Notes (Signed)
[facility]  PATIENT: Holly [name] DOB: March 06, 1936   REASON FOR VISIT: Follow-up for history of stroke in May 2016, mild cognitive impairment HISTORY FROM: Patient and daughter Holly [name]    HISTORY OF PRESENT ILLNESS: [name], 80 year old female returns for follow-up with her daughter. 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 She was placed on aspirin and Plavix and is now solely on Plavix for secondary stroke prevention. She also has a history of hypertension hyperlipidemia diabetes right Bell's palsy with residual right upper and lower facial weakness. Her husband died in 01-25-2015 and she lives alone. Daughter is from [place]. She returns for reevaluation. She has not had further stroke or TIA symptoms. She has multiple questions.  HISTORY 7/14/16YYShirley VICTORIA [name] is a 80 year old right-handed female, accompanied by her daughter Holly [name], seen in refer by her primary care physician Dr. Fulton Reek for stroke.  She had a history of hypertension, hyperlipidemia, diabetes, right Bell's palsy, with residual right upper and lower face weakness, synergistic movement of right facial muscles.  In Aug 27 2014, she presented with acute onset dizziness, nausea, ataxia, was admitted to stroke service. I have reviewed the chart, and personally reviewed MRI films. 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 CT angiogram of head and neck, left vertebral artery occlusion Echocardiogram unremarkable Laboratory evaluations, LDL 159, A1c 5.8 During hospital stay, she also has mild elevated troponin to 0.24, Bradycardia/heart block, resolved, cardiology was consult, likely newly mediated/vasovagal due to associated nausea and vomiting.    REVIEW OF SYSTEMS: Full 14 system review of systems performed and notable only for those listed, all others are neg:  Constitutional: neg  Cardiovascular: neg Ear/Nose/Throat:  neg  Skin: neg Eyes: Cataract  Respiratory: neg Gastroitestinal: Urinary frequency  Hematology/Lymphatic: neg  Endocrine: neg Musculoskeletal:neg Allergy/Immunology: neg Neurological: neg Psychiatric: neg Sleep : neg   ALLERGIES: No Known Allergies  HOME MEDICATIONS: Outpatient Prescriptions Prior to Visit  Medication Sig Dispense Refill  . atorvastatin (LIPITOR) 40 MG tablet Take 1 tablet (40 mg total) by mouth daily at 6 PM. 90 tablet 3  . BIOTIN 5000 PO Take by mouth.    . clopidogrel (PLAVIX) 75 MG tablet Take 1 tablet (75 mg total) by mouth daily. 90 tablet 3  . Coenzyme Q10 (CO Q 10) 10 MG CAPS Take 10 mg by mouth daily.    Marland Kitchen losartan (COZAAR) 25 MG tablet Take 25 mg by mouth daily.    . metFORMIN (GLUCOPHAGE) 500 MG tablet Take 500 mg by mouth 2 (two) times daily with a meal.    . metoprolol (LOPRESSOR) 50 MG tablet Take 50 mg by mouth. Take one tablet once daily/patient reported    . Red Yeast Rice Extract (RED YEAST RICE PO) Take by mouth.    . vitamin C (ASCORBIC ACID) 500 MG tablet Take 500 mg by mouth daily.    Marland Kitchen aspirin 81 MG chewable tablet Chew 1 tablet (81 mg total) by mouth daily. (Patient not taking: Reported on 05/10/2015) 30 tablet 2   No facility-administered medications prior to visit.    PAST MEDICAL HISTORY: Past Medical History  Diagnosis Date  . Vertigo   . Hypertension   . Diabetes mellitus without complication (HCC)     non insulin dependent  . Bell's palsy   . CVA (cerebral infarction)     PAST SURGICAL HISTORY: Past Surgical History  Procedure Laterality Date  . Breast surgery  Benign tumor  . Tonsillectomy and adenoidectomy    . Breast excisional biopsy Right 1964    neg    FAMILY HISTORY: Family History  Problem Relation Age of Onset  . Cancer Mother     Marena Chancy of type - "female"  . Diabetes Father   . Lung cancer Brother     SOCIAL HISTORY: Social History   Social History  . Marital Status: Married    Spouse  Name: N/A  . Number of Children: 1  . Years of Education: 12   Occupational History  . Retired    Social History Main Topics  . Smoking status: Never Smoker   . Smokeless tobacco: Not on file  . Alcohol Use: No  . Drug Use: No  . Sexual Activity: Not on file   Other Topics Concern  . Not on file   Social History Narrative   Lives at home with husband.   Right-handed.   1-2 cups of caffeine per day.     PHYSICAL EXAM  Filed Vitals:   05/10/15 1131  BP: 181/76  Pulse: 60  Height: 5\' 7"  (1.702 m)  Weight: 151 lb 6.4 oz (68.675 kg)   Body mass index is 23.71 kg/(m^2). Gen: NAD, conversant, well nourised, obese, well groomed  Cardiovascular: Regular rate rhythm,  Neck: Supple, no carotid bruit. Pulmonary: Clear to auscultation bilaterally   NEUROLOGICAL EXAM:  MENTAL STATUS: Speech:  Speech is normal; fluent and spontaneous with normal comprehension.  Cognition:  The patient is oriented to person, place, and time;   Mini-Mental Status exam is 29 out of 30, she missed one out of 3 recalls. AFT 13. Clock drawing 4/4  language fluent; normal attention, concentration, fund of knowledge.  CRANIAL NERVES: CN II: Visual fields are full to confrontation. Fundoscopic exam is normal with sharp discs were equal round reactive to light. CN III, IV, VI: extraocular movement are normal. No ptosis. CN V: Facial sensation is intact to pinprick in all 3 divisions bilaterally.   CN VII: Right upper and lower face weakness, synergistic movement, tearing from right eye CN VIII: Hearing is normal to rubbing fingers CN IX, X: Palate elevates symmetrically. Phonation is normal. CN XI: Head turning and shoulder shrug are intact CN XII: Tongue is midline with normal movements and no atrophy.  MOTOR:There is no pronator drift of out-stretched arms. Muscle bulk and tone are normal. Muscle strength is normal. REFLEXES:Reflexes are 2+ and symmetric at the  biceps, triceps, knees, and ankles. Plantar responses are flexor. SENSORY:Light touch, pinprick, position sense, and vibration sense are intact in fingers and toes. COORDINATION:Rapid alternating movements and fine finger movements are intact. There is no dysmetria on finger-to-nose and heel-knee-shin. There are no abnormal or extraneous movements.  GAIT/STANCE: Posture is normal. Gait is steady with normal steps, base, arm swing, and turning. Heel and toe walking are normal. Tandem gait is normal. No assistive device Romberg is absent.   DIAGNOSTIC DATA (LABS, IMAGING, TESTING) - I reviewed patient records, labs, notes, testing and imaging myself where available.  Lab Results  Component Value Date   WBC 8.0 08/31/2014   HGB 12.3 08/31/2014   HCT 37.5 08/31/2014   MCV 89.1 08/31/2014   PLT 210 08/31/2014      Component Value Date/Time   NA 137 08/31/2014 0555   K 3.4* 08/31/2014 0555   CL 105 08/31/2014 0555   CO2 24 08/31/2014 0555   GLUCOSE 147* 08/31/2014 0555   BUN 21* 08/31/2014 0555  CREATININE 0.68 08/31/2014 0555   CALCIUM 8.3* 08/31/2014 0555   PROT 6.6 08/27/2014 1020   ALBUMIN 3.9 08/27/2014 1020   AST 19 08/27/2014 1020   ALT 14 08/27/2014 1020   ALKPHOS 54 08/27/2014 1020   BILITOT 1.0 08/27/2014 1020   GFRNONAA >60 08/31/2014 0555   GFRAA >60 08/31/2014 0555   Lab Results  Component Value Date   CHOL 232* 08/28/2014   HDL 54 08/28/2014   LDLCALC 159* 08/28/2014   TRIG 97 08/28/2014   CHOLHDL 4.3 08/28/2014   Lab Results  Component Value Date   HGBA1C 6.8* 08/28/2014    ASSESSMENT AND PLAN  80 y.o. year old female  has a past medical history of posterior circulatory stroke due to atherosclerotic disease , Vertigo; Hypertension; Diabetes mellitus without complication ([place]); Bell's palsy;here to follow-up.  Continue Plavix for seconary stroke prevention B/P less than [id number] systolic is ideal todays reading 150/80 Keep LDL < 70 followed by primary  care Memory score is stable  Additional 10 minutes spent answering multiple sessions regarding follow-up care etc. Patient is currently not driving due to cataracts F/U in 6 months Dennie Bible, [facility], [facility], [facility] [address], [place], [place] [address] [phone]

## 2015-05-10 NOTE — Progress Notes (Signed)
I have reviewed and agreed above plan. 

## 2015-05-10 NOTE — Patient Instructions (Signed)
Continue Plavix for seconary stroke prevention B/P less than [id number] systolic is ideal LDL < 70 Memory exercises F/U in 6 months

## 2015-08-30 DIAGNOSIS — E041 Nontoxic single thyroid nodule: Secondary | ICD-10-CM | POA: Insufficient documentation

## 2015-10-23 HISTORY — PX: OTHER SURGICAL HISTORY: SHX169

## 2015-11-08 ENCOUNTER — Ambulatory Visit (INDEPENDENT_AMBULATORY_CARE_PROVIDER_SITE_OTHER): Payer: Medicare HMO | Admitting: Nurse Practitioner

## 2015-11-08 ENCOUNTER — Encounter: Payer: Self-pay | Admitting: Nurse Practitioner

## 2015-11-08 VITALS — BP 149/74 | HR 56 | Ht 67.0 in | Wt 155.2 lb

## 2015-11-08 DIAGNOSIS — I7774 Dissection of vertebral artery: Secondary | ICD-10-CM | POA: Diagnosis not present

## 2015-11-08 DIAGNOSIS — I10 Essential (primary) hypertension: Secondary | ICD-10-CM

## 2015-11-08 DIAGNOSIS — I6330 Cerebral infarction due to thrombosis of unspecified cerebral artery: Secondary | ICD-10-CM

## 2015-11-08 NOTE — Patient Instructions (Signed)
Continue Plavix for seconary stroke prevention B/P less than [id number] systolic is ideal todays reading 149/74 Keep LDL < 70 followed by primary care Patient is currently not driving due to cataracts F/U in 6 months, next with Dr. Krista Blue

## 2015-11-08 NOTE — Progress Notes (Signed)
[facility]  PATIENT: Holly [name] DOB: 29-Jan-1936   REASON FOR VISIT: follow-up for cerebral infarction, vertebral artery dissection hyperlipidemia and essential hypertension,  HISTORY FROM:patient     HISTORY OF PRESENT ILLNESS: UPDATE 11/08/2015 CM 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 She was placed on aspirin and Plavix and is now solely on Plavix for secondary stroke prevention.he has not had further stroke or TIA symptoms She also has a history of right Bell's palsy with residual right upper and lower facial weakness.She has cataracts and is unable to drive at present. She reports her last LDL was 60.She claims her diabetes is in good control Blood pressure mildly elevated today at 149/70. She has not taken her blood pressure medicine this morning She returns for reevaluation.  UPDATE 05/10/2015 CMMs. Holly [name], 80 year old female returns for follow-up with her daughter. 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 She was placed on aspirin and Plavix and is now solely on Plavix for secondary stroke prevention. She also has a history of hypertension hyperlipidemia diabetes right Bell's palsy with residual right upper and lower facial weakness. Her husband died in 2015/02/19 and she lives alone. Daughter is from [place]. She returns for reevaluation. She has not had further stroke or TIA symptoms. She has multiple questions.  HISTORY 7/14/16YYShirley DETA [name] is a 80 year old right-handed female, accompanied by her daughter Lattie Haw, seen in refer by her primary care physician Dr. Fulton Reek for stroke.  She had a history of hypertension, hyperlipidemia, diabetes, right Bell's palsy, with residual right upper and lower face weakness, synergistic movement of right facial muscles.  In Aug 27 2014, she presented with acute onset dizziness, nausea,  ataxia, was admitted to stroke service. I have reviewed the chart, and personally reviewed MRI films. 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 CT angiogram of head and neck, left vertebral artery occlusion Echocardiogram unremarkable Laboratory evaluations, LDL 159, A1c 5.8 During hospital stay, she also has mild elevated troponin to 0.24, Bradycardia/heart block, resolved, cardiology was consult, likely newly mediated/vasovagal due to associated nausea and vomiting.   REVIEW OF SYSTEMS: Full 14 system review of systems performed and notable only for those listed, all others are neg:  Constitutional: neg  Cardiovascular: neg Ear/Nose/Throat: neg  Skin: neg Eyes: blurred vision Respiratory: neg Gastroitestinal: neg  Hematology/Lymphatic: easy bruising Endocrine: neg Musculoskeletal:neg Allergy/Immunology: neg Neurological: right Bell's palsy Psychiatric: neg Sleep : neg   ALLERGIES: No Known Allergies  HOME MEDICATIONS: Outpatient Prescriptions Prior to Visit  Medication Sig Dispense Refill  . atorvastatin (LIPITOR) 40 MG tablet Take 1 tablet (40 mg total) by mouth daily at 6 PM. 90 tablet 3  . clopidogrel (PLAVIX) 75 MG tablet Take 1 tablet (75 mg total) by mouth daily. 90 tablet 3  . Coenzyme Q10 (CO Q 10) 10 MG CAPS Take 10 mg by mouth daily.    . metFORMIN (GLUCOPHAGE) 500 MG tablet Take 500 mg by mouth 2 (two) times daily with a meal.    . metoprolol (LOPRESSOR) 50 MG tablet Take 50 mg by mouth. Take one tablet once daily/patient reported    . Naproxen Sod-Diphenhydramine (ALEVE PM PO) Take by mouth as needed.    Marland Kitchen BIOTIN 5000 PO Take by mouth. Reported on 11/08/2015    . losartan (COZAAR) 25 MG tablet Take 25 mg by mouth daily. Reported on  11/08/2015    . Red Yeast Rice Extract (RED YEAST RICE PO) Take by mouth. Reported on 11/08/2015    . vitamin C (ASCORBIC ACID) 500 MG tablet Take 500 mg by mouth daily. Reported on 11/08/2015     No  facility-administered medications prior to visit.    PAST MEDICAL HISTORY: Past Medical History  Diagnosis Date  . Vertigo   . Hypertension   . Diabetes mellitus without complication (HCC)     non insulin dependent  . Bell's palsy   . CVA (cerebral infarction)     PAST SURGICAL HISTORY: Past Surgical History  Procedure Laterality Date  . Breast surgery      Benign tumor  . Tonsillectomy and adenoidectomy    . Breast excisional biopsy Right 1964    neg    FAMILY HISTORY: Family History  Problem Relation Age of Onset  . Cancer Mother     Marena Chancy of type - "female"  . Diabetes Father   . Lung cancer Brother     SOCIAL HISTORY: Social History   Social History  . Marital Status: Married    Spouse Name: N/A  . Number of Children: 1  . Years of Education: 12   Occupational History  . Retired    Social History Main Topics  . Smoking status: Never Smoker   . Smokeless tobacco: Not on file  . Alcohol Use: No  . Drug Use: No  . Sexual Activity: Not on file   Other Topics Concern  . Not on file   Social History Narrative   Lives at home with husband.   Right-handed.   1-2 cups of caffeine per day.     PHYSICAL EXAM  Filed Vitals:   11/08/15 1058  BP: 149/74  Pulse: 56  Height: 5\' 7"  (1.702 m)  Weight: 155 lb 3.2 oz (70.398 kg)   Body mass index is 24.3 kg/(m^2). Gen: NAD, conversant, well nourised,, well groomed  Cardiovascular: Regular rate rhythm,  Neck: Supple, no carotid bruit. Pulmonary: Clear to auscultation bilaterally   NEUROLOGICAL EXAM:  MENTAL STATUS: Speech: Speech is normal; fluent and spontaneous with normal comprehension.  Cognition:The patient is oriented to person, place, and time;  language fluent; normal attention, concentration, fund of knowledge.  CRANIAL NERVES: CN II: Visual fields are full to confrontation. Fundoscopic exam is normal with sharp discs were equal round reactive to light. CN III,  IV, VI: extraocular movement are normal. No ptosis. CN V: Facial sensation is intact to pinprick in all 3 divisions bilaterally.  CN VII: Right upper and lower face weakness CN VIII: Hearing is normal to rubbing fingers CN IX, X: Palate elevates symmetrically. Phonation is normal. CN XI: Head turning and shoulder shrug are intact CN XII: Tongue is midline with normal movements and no atrophy.  MOTOR:There is no pronator drift of out-stretched arms. Muscle bulk and tone are normal. Muscle strength is normal. REFLEXES:Reflexes are 2+ and symmetric at the biceps, triceps, knees, and ankles. Plantar responses are flexor. SENSORY:Light touch, pinprick, position sense, and vibration sense are intact in fingers and toes. COORDINATION:Rapid alternating movements and fine finger movements are intact. There is no dysmetria on finger-to-nose and heel-knee-shin. There are no abnormal or extraneous movements.  GAIT/STANCE:Posture is normal. Gait is steady with normal steps, base, arm swing, and turning. Heel and toe walking are normal. Tandem gait is normal. No assistive device Romberg is negative.  DIAGNOSTIC DATA (LABS, IMAGING, TESTING) - ASSESSMENT AND PLAN 80 y.o. year old female has  a past medical history of posterior circulatory stroke due to atherosclerotic disease , Hypertension; Diabetes mellitus without complication ([place]); Bell's palsy;here to follow-up.  Continue Plavix for seconary stroke prevention Keep B/P less than [id number] systolic is ideal todays reading 149/74 Keep LDL < 70 followed by primary care most recent 60 according to patient Patient is currently not driving due to cataracts F/U in 6 months, next with Dr. Luan Pulling, [facility], [facility], [facility] [address] [place], [place] [address] [phone]

## 2015-11-09 NOTE — Progress Notes (Signed)
I have reviewed and agreed above plan. 

## 2016-02-04 ENCOUNTER — Other Ambulatory Visit: Payer: Self-pay | Admitting: Neurology

## 2016-02-23 HISTORY — PX: CATARACT EXTRACTION: SUR2

## 2016-05-15 ENCOUNTER — Ambulatory Visit: Payer: Medicare HMO | Admitting: Neurology

## 2016-06-08 ENCOUNTER — Other Ambulatory Visit: Payer: Self-pay | Admitting: Nurse Practitioner

## 2016-06-13 ENCOUNTER — Encounter: Payer: Self-pay | Admitting: Neurology

## 2016-06-13 ENCOUNTER — Ambulatory Visit (INDEPENDENT_AMBULATORY_CARE_PROVIDER_SITE_OTHER): Payer: Medicare HMO | Admitting: Neurology

## 2016-06-13 VITALS — BP 167/84 | HR 72 | Ht 67.0 in | Wt 155.2 lb

## 2016-06-13 DIAGNOSIS — I63112 Cerebral infarction due to embolism of left vertebral artery: Secondary | ICD-10-CM | POA: Diagnosis not present

## 2016-06-13 NOTE — Progress Notes (Signed)
[facility]  PATIENT: Holly [name] DOB: April 05, 1936   HISTORY OF PRESENT ILLNESS:  Holly [name] is a 81 year old right-handed female, accompanied by her daughter Lattie Haw, seen in refer by her primary care physician Dr. Fulton Reek for stroke. Initial evaluation was July 2016.  She had a history of hypertension, hyperlipidemia, diabetes, right Bell's palsy, with residual right upper and lower face weakness, synergistic movement of right facial muscles.  On  Aug 27 2014, she presented with acute onset dizziness, nausea, ataxia, was admitted to stroke service. I have reviewed the chart, and personally reviewed MRI films. 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 CT angiogram of head and neck, left vertebral artery occlusion Echocardiogram unremarkable Laboratory evaluations, LDL 159, A1c 5.8  During hospital stay, she also has mild elevated troponin to 0.24, Bradycardia/heart block, resolved, cardiology was consult, likely neural mediated/vasovagal due to associated nausea and vomiting.  She was placed on double antiplatelet agent aspirin and Plavix for 6 months, now on Plavix only  UPDATE Jun 13 2016:  She is doing well, there is no recurrent symptoms.  REVIEW OF SYSTEMS: Full 14 system review of systems performed and notable only for those listed, all others are neg:   as above.  ALLERGIES: No Known Allergies  HOME MEDICATIONS: Outpatient Medications Prior to Visit  Medication Sig Dispense Refill  . atorvastatin (LIPITOR) 40 MG tablet TAKE 1 TABLET BY MOUTH EVERY EVENING 90 tablet 3  . clopidogrel (PLAVIX) 75 MG tablet TAKE 1 TABLET BY MOUTH EVERY DAY 90 tablet 0  . Coenzyme Q10 (CO Q 10) 10 MG CAPS Take 10 mg by mouth daily.    . metFORMIN (GLUCOPHAGE) 500 MG tablet Take 500 mg by mouth 2 (two) times daily with a meal.    . metoprolol (LOPRESSOR) 50 MG tablet Take 50 mg by mouth. Take one tablet once daily/patient reported     . Naproxen Sod-Diphenhydramine (ALEVE PM PO) Take by mouth as needed.    Marland Kitchen OVER THE COUNTER MEDICATION Healthy Hair skin and Nails.  One daily     No facility-administered medications prior to visit.     PAST MEDICAL HISTORY: Past Medical History:  Diagnosis Date  . Bell's palsy 1985  . CVA (cerebral infarction)   . Diabetes mellitus without complication (HCC)    non insulin dependent  . Hypertension   . Vertigo     PAST SURGICAL HISTORY: Past Surgical History:  Procedure Laterality Date  . BREAST EXCISIONAL BIOPSY Right 1964   neg  . BREAST SURGERY     Benign tumor  . CATARACT EXTRACTION Bilateral 02/2016  . thyroid nodules bx Bilateral 10/2015    [facility], Dr. Gabriel Carina  (negative)  . TONSILLECTOMY AND ADENOIDECTOMY      FAMILY HISTORY: Family History  Problem Relation Age of Onset  . Cancer Mother     Marena Chancy of type - "female"  . Diabetes Father   . Lung cancer Brother     SOCIAL HISTORY: Social History   Social History  . Marital status: Married    Spouse name: N/A  . Number of children: 1  . Years of education: 40   Occupational History  . Retired    Social History Main Topics  . Smoking status: Never Smoker  . Smokeless tobacco: Never Used  . Alcohol use No  . Drug use: No  . Sexual activity: Not on file   Other Topics Concern  . Not on file  Social History Narrative   Lives at home with husband.   Right-handed.   1-2 cups of caffeine per day.     PHYSICAL EXAM  Vitals:   06/13/16 1055  BP: (!) 167/84  Pulse: 72  Weight: 155 lb 4 oz (70.4 kg)  Height: 5\' 7"  (1.702 m)   Body mass index is 24.32 kg/m. Gen: NAD, conversant, well nourised,, well groomed  Cardiovascular: Regular rate rhythm,  Neck: Supple, no carotid bruit. Pulmonary: Clear to auscultation bilaterally   NEUROLOGICAL EXAM:  MENTAL STATUS: Speech: Speech is normal; fluent and spontaneous with normal comprehension.  Cognition:The patient is  oriented to person, place, and time;  language fluent; normal attention, concentration, fund of knowledge.  CRANIAL NERVES: CN II: Visual fields are full to confrontation. Fundoscopic exam is normal with sharp discs were equal round reactive to light. CN III, IV, VI: extraocular movement are normal. No ptosis. CN V: Facial sensation is intact to pinprick in all 3 divisions bilaterally.  CN VII: mild right upper and lower face weakness, synergistic movement CN VIII: Hearing is normal to rubbing fingers CN IX, X: Palate elevates symmetrically. Phonation is normal. CN XI: Head turning and shoulder shrug are intact CN XII: Tongue is midline with normal movements and no atrophy.  MOTOR:There is no pronator drift of out-stretched arms. Muscle bulk and tone are normal. Muscle strength is normal. REFLEXES:Reflexes are 2+ and symmetric at the biceps, triceps, knees, and ankles. Plantar responses are flexor. SENSORY:Light touch, pinprick, position sense, and vibration sense are intact in fingers and toes. COORDINATION:Rapid alternating movements and fine finger movements are intact. There is no dysmetria on finger-to-nose and heel-knee-shin. There are no abnormal or extraneous movements.  GAIT/STANCE:Posture is normal. Gait is steady with normal steps, base, arm swing, and turning. Heel and toe walking are normal. Tandem gait is normal. No assistive device Romberg is negative.  DIAGNOSTIC DATA (LABS, IMAGING, TESTING) - ASSESSMENT AND PLAN 81 y.o. year old female  Left PICA stroke in May 2016 due to left vertebral artery occlusion.  Continue Plavix for seconary stroke prevention Keep B/P less than 130/80 Keep LDL < 70 followed by primary care most recent 60 according to patient Continue follow up with PCP  Marcial Pacas, M.D. Ph.D.  [facility] [place], [place] [address] Phone: [phone] Fax:      [phone]

## 2016-06-14 ENCOUNTER — Other Ambulatory Visit: Payer: Self-pay | Admitting: Internal Medicine

## 2016-06-14 DIAGNOSIS — Z1231 Encounter for screening mammogram for malignant neoplasm of breast: Secondary | ICD-10-CM

## 2016-07-10 ENCOUNTER — Ambulatory Visit
Admission: RE | Admit: 2016-07-10 | Discharge: 2016-07-10 | Disposition: A | Discharge status: Home / Self Care | Payer: Medicare HMO | Source: Ambulatory Visit | Attending: Internal Medicine | Admitting: Internal Medicine

## 2016-07-10 DIAGNOSIS — Z1231 Encounter for screening mammogram for malignant neoplasm of breast: Secondary | ICD-10-CM | POA: Diagnosis present

## 2016-07-15 IMAGING — MG MM DIGITAL SCREENING BILAT W/ CAD
4 series · 4 of 4 positions shown · non-contrast
Comparison: Previous exam(s).

CLINICAL DATA: Screening.

EXAM:
DIGITAL SCREENING BILATERAL MAMMOGRAM WITH CAD

[R CC]
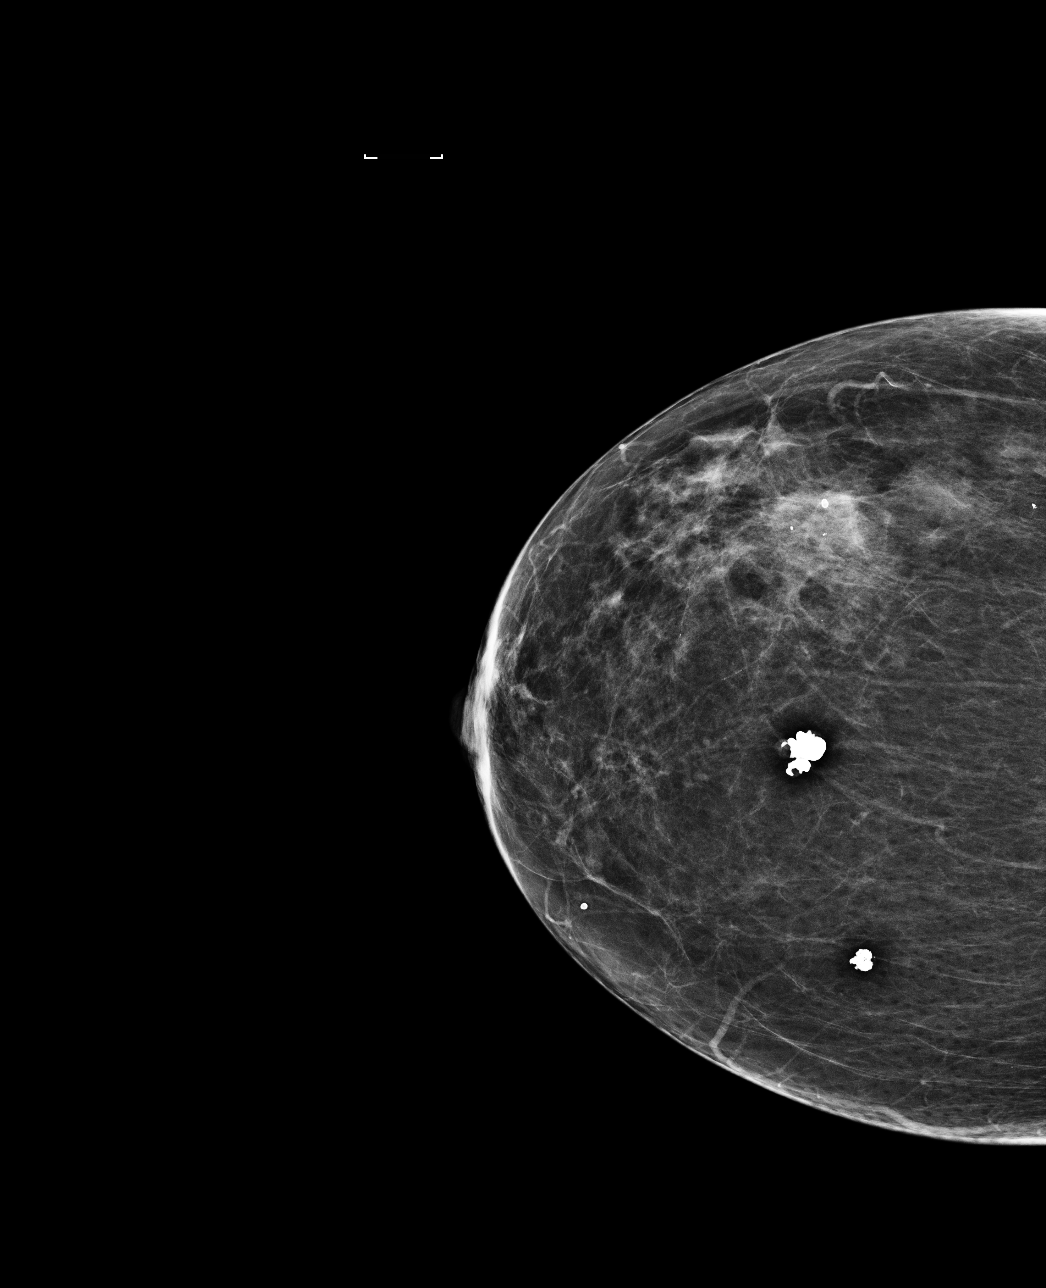

[L CC]
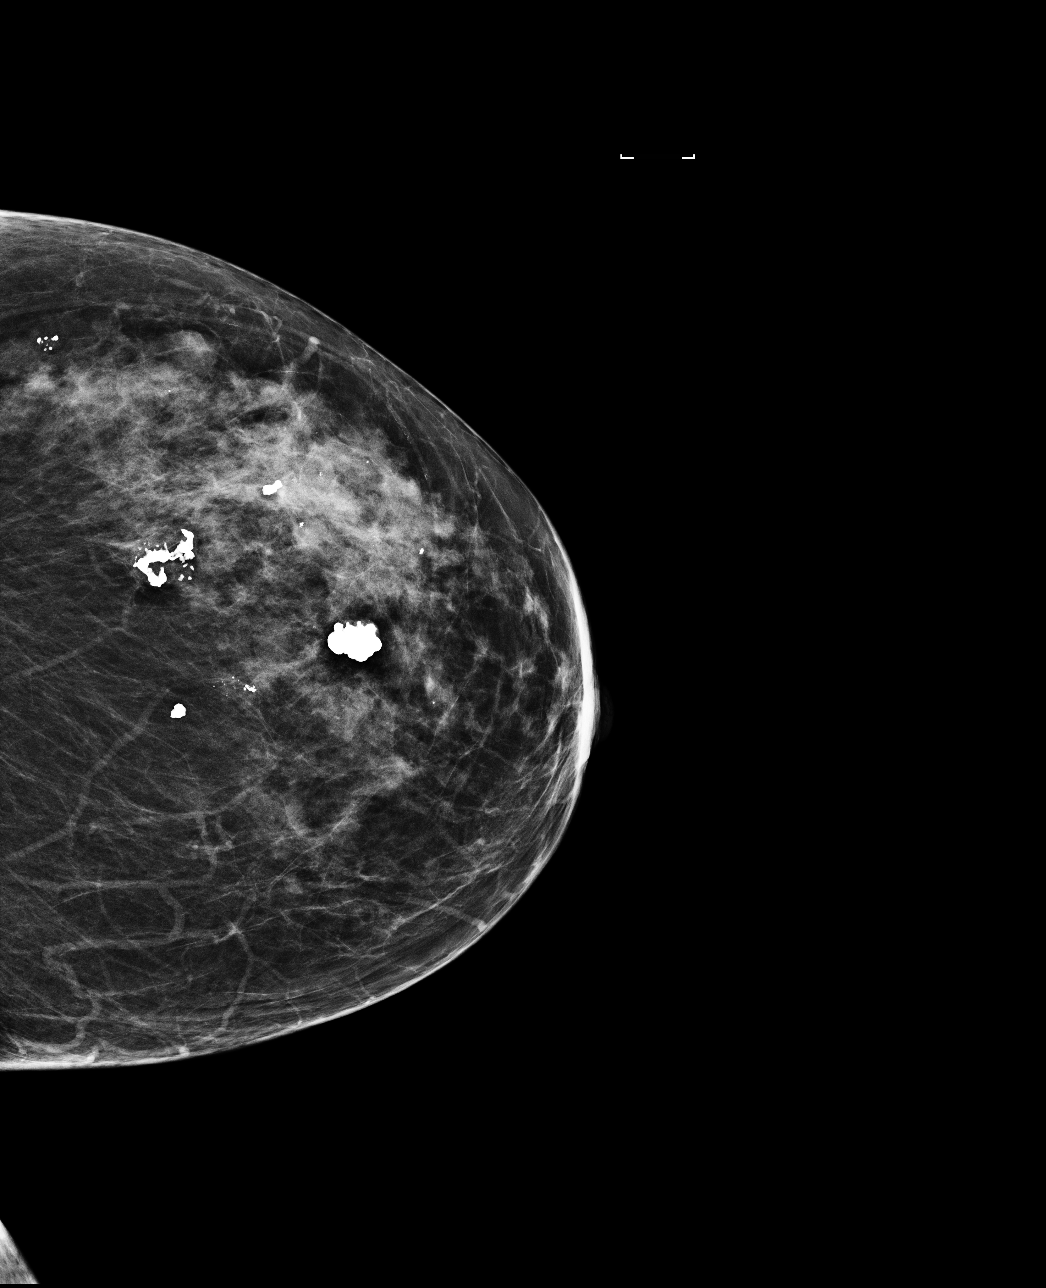

[R MLO]
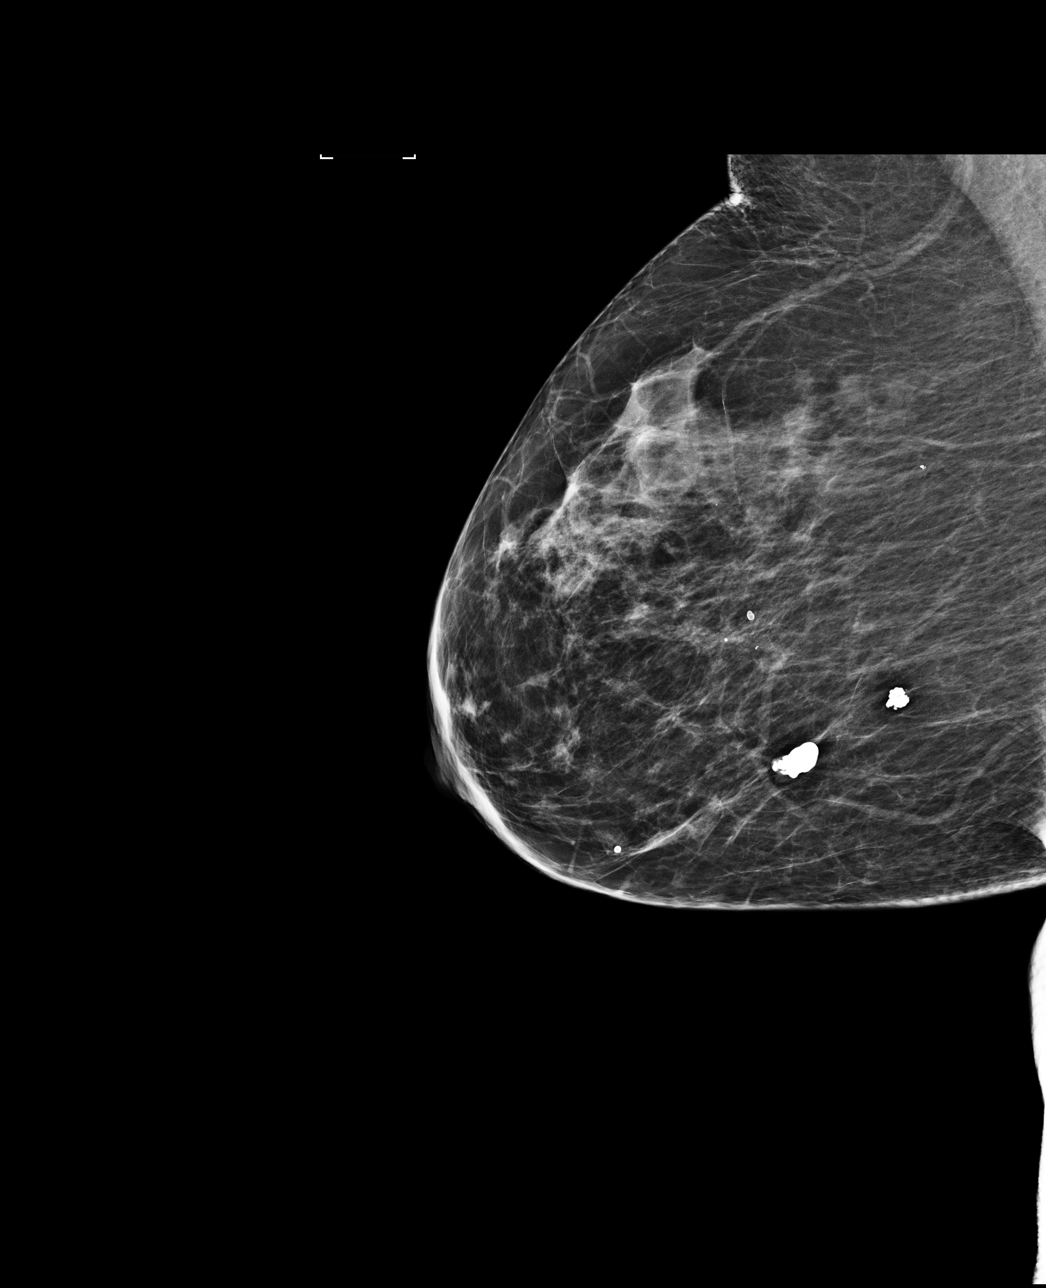

[L MLO]
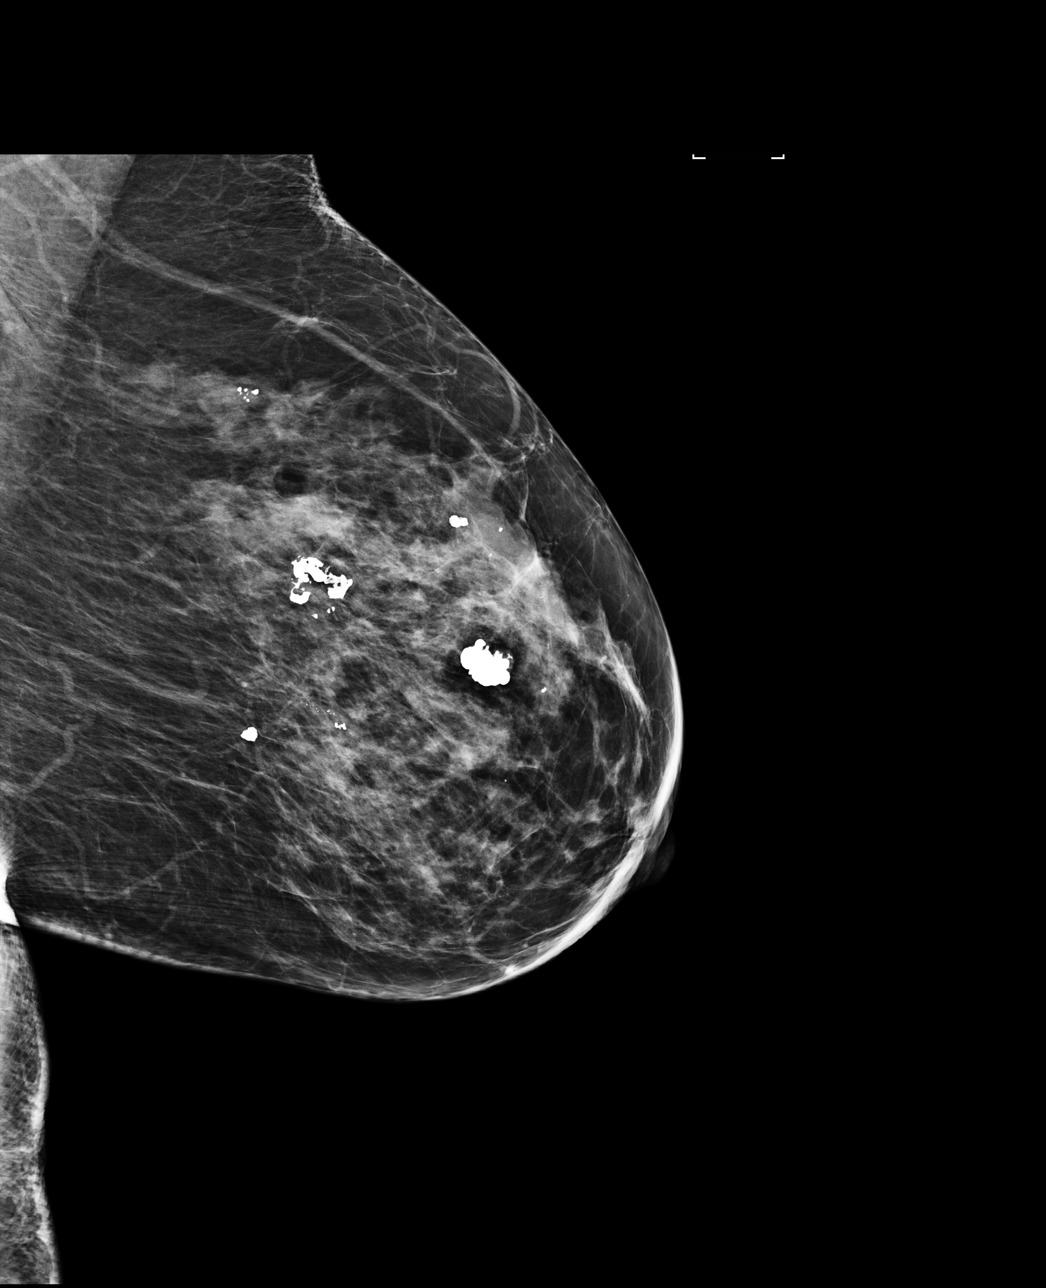

[4 of 4 positions shown; findings below may reference images not displayed]

ACR Breast Density Category c: The breast tissue is heterogeneously
dense, which may obscure small masses.
FINDINGS: There are no findings suspicious for malignancy. Images were
processed with CAD.
IMPRESSION: No mammographic evidence of malignancy. A result letter of this
screening mammogram will be mailed directly to the patient.

RECOMMENDATION:
Screening mammogram in one year. (Code:[id number])

BI-RADS CATEGORY  1: Negative.

## 2017-03-17 ENCOUNTER — Other Ambulatory Visit: Payer: Self-pay | Admitting: Neurology

## 2017-06-13 ENCOUNTER — Other Ambulatory Visit: Payer: Self-pay | Admitting: Neurology

## 2017-10-17 ENCOUNTER — Other Ambulatory Visit: Payer: Self-pay | Admitting: Internal Medicine

## 2017-10-17 DIAGNOSIS — Z1231 Encounter for screening mammogram for malignant neoplasm of breast: Secondary | ICD-10-CM

## 2017-10-30 ENCOUNTER — Encounter (INDEPENDENT_AMBULATORY_CARE_PROVIDER_SITE_OTHER): Payer: Self-pay | Admitting: Vascular Surgery

## 2017-10-30 ENCOUNTER — Ambulatory Visit (INDEPENDENT_AMBULATORY_CARE_PROVIDER_SITE_OTHER): Payer: Medicare HMO | Admitting: Vascular Surgery

## 2017-10-30 VITALS — BP 181/80 | HR 70 | Resp 16 | Ht 66.0 in | Wt 152.6 lb

## 2017-10-30 DIAGNOSIS — I83819 Varicose veins of unspecified lower extremities with pain: Secondary | ICD-10-CM

## 2017-10-30 DIAGNOSIS — I10 Essential (primary) hypertension: Secondary | ICD-10-CM | POA: Diagnosis not present

## 2017-10-30 DIAGNOSIS — E785 Hyperlipidemia, unspecified: Secondary | ICD-10-CM

## 2017-10-30 DIAGNOSIS — E119 Type 2 diabetes mellitus without complications: Secondary | ICD-10-CM | POA: Diagnosis not present

## 2017-10-30 NOTE — Assessment & Plan Note (Signed)
blood pressure control important in reducing the progression of atherosclerotic disease. On appropriate oral medications.  

## 2017-10-30 NOTE — Assessment & Plan Note (Signed)
blood glucose control important in reducing the progression of atherosclerotic disease. Also, involved in wound healing. On appropriate medications.  

## 2017-10-30 NOTE — Assessment & Plan Note (Signed)
lipid control important in reducing the progression of atherosclerotic disease. Continue statin therapy  

## 2017-10-30 NOTE — Progress Notes (Signed)
Patient ID: Holly [name], female   DOB: 1936-02-24, 82 y.o.   MRN: [id number]  Chief Complaint  Patient presents with  . New Patient (Initial Visit)    ref Holly [name] for Varicosities    HPI Holly [name] is a 82 y.o. female.  I am asked to see the patient by Dr. Doy [name] for evaluation of varicose veins.  The patient presents with complaints of symptomatic varicosities of the legs. The patient reports a long standing history of varicosities and they have become painful over time.  She states that this really just started over the past year or so.  There was no clear inciting event or causative factor that started the symptoms, but once she was started on Plavix for cerebrovascular issues she has noticed more bruising and more varicosities.  The left leg is more severly affected. The patient elevates the legs for relief. The pain is described as stinging and burning in her feet and lower legs. The symptoms are generally most severe in the evening, particularly when they have been on their feet for long periods of time.  Elevation has been used to try to improve the symptoms with some success. The patient complains of some increasing swelling as an associated symptom, particularly in the left foot and ankle. The patient has no previous history of deep venous thrombosis or superficial thrombophlebitis to their knowledge.     Past Medical History:  Diagnosis Date  . Bell's palsy 1985  . CVA (cerebral infarction)   . Diabetes mellitus without complication (HCC)    non insulin dependent  . Hypertension   . Vertigo     Past Surgical History:  Procedure Laterality Date  . BREAST EXCISIONAL BIOPSY Right 1964   neg  . BREAST SURGERY     Benign tumor  . CATARACT EXTRACTION Bilateral 02/2016  . thyroid nodules bx Bilateral 10/2015    [facility], Dr. Gabriel [name]  (negative)  . TONSILLECTOMY AND ADENOIDECTOMY      Family History  Problem Relation Age of Onset  . Cancer Mother        Holly [name] of  type - "female"  . Diabetes Father   . Lung cancer Brother   No bleeding or clotting disorders  Social History Social History   Tobacco Use  . Smoking status: Never Smoker  . Smokeless tobacco: Never Used  Substance Use Topics  . Alcohol use: No  . Drug use: No    No Known Allergies  Current Outpatient Medications  Medication Sig Dispense Refill  . atorvastatin (LIPITOR) 40 MG tablet TAKE 1 TABLET BY MOUTH EVERY EVENING 90 tablet 0  . clopidogrel (PLAVIX) 75 MG tablet TAKE 1 TABLET BY MOUTH EVERY DAY 90 tablet 0  . Coenzyme Q10 (CO Q 10) 10 MG CAPS Take 10 mg by mouth daily.    . metFORMIN (GLUCOPHAGE) 500 MG tablet Take 500 mg by mouth 2 (two) times daily with a meal.    . metoprolol (LOPRESSOR) 50 MG tablet Take 50 mg by mouth. Take one tablet once daily/patient reported    . Naproxen Sod-Diphenhydramine (ALEVE PM PO) Take by mouth as needed.    Marland Kitchen OVER THE COUNTER MEDICATION Healthy Hair skin and Nails.  One daily     No current facility-administered medications for this visit.       REVIEW OF SYSTEMS (Negative unless checked)  Constitutional: [] Weight loss  [] Fever  [] Chills Cardiac: [] Chest pain   [] Chest pressure   [] Palpitations   [] Shortness  of breath when laying flat   [] Shortness of breath at rest   [] Shortness of breath with exertion. Vascular:  [] Pain in legs with walking   [] Pain in legs at rest   [] Pain in legs when laying flat   [] Claudication   [] Pain in feet when walking  [] Pain in feet at rest  [] Pain in feet when laying flat   [] History of DVT   [] Phlebitis   [x] Swelling in legs   [x] Varicose veins   [] Non-healing ulcers Pulmonary:   [] Uses home oxygen   [] Productive cough   [] Hemoptysis   [] Wheeze  [] COPD   [] Asthma Neurologic:  [] Dizziness  [] Blackouts   [] Seizures   [x] History of stroke   [x] History of TIA  [] Aphasia   [] Temporary blindness   [] Dysphagia   [] Weakness or numbness in arms   [] Weakness or numbness in legs Musculoskeletal:  [x] Arthritis    [] Joint swelling   [] Joint pain   [] Low back pain Hematologic:  [] Easy bruising  [] Easy bleeding   [] Hypercoagulable state   [] Anemic  [] Hepatitis Gastrointestinal:  [] Blood in stool   [] Vomiting blood  [] Gastroesophageal reflux/heartburn   [] Abdominal pain Genitourinary:  [] Chronic kidney disease   [] Difficult urination  [] Frequent urination  [] Burning with urination   [] Hematuria Skin:  [] Rashes   [] Ulcers   [] Wounds Psychological:  [] History of anxiety   []  History of major depression.    Physical Exam BP (!) 181/80 (BP Location: Right Arm)   Pulse 70   Resp 16   Ht 5\' 6"  (1.676 m)   Wt 152 lb 9.6 oz (69.2 kg)   BMI 24.63 kg/m  Gen:  WD/WN, NAD.  Appears younger than stated age Head: [place]/AT, No temporalis wasting.  Ear/Nose/Throat: Hearing grossly intact, dentition good Eyes: Sclera non-icteric. Conjunctiva clear Neck: Supple. Trachea midline Pulmonary:  Good air movement, no use of accessory muscles, respirations not labored.  Cardiac: RRR, No JVD Vascular: Varicosities scattered and measuring up to 1-2 mm in the right lower extremity        Varicosities scattered and measuring up to 1-2 mm in the left lower extremity Vessel Right Left  Radial Palpable Palpable                          PT Palpable Palpable  DP Palpable Palpable    Musculoskeletal: M/S 5/5 throughout.   No RLE edema.  Trace LLE edema Neurologic: Sensation grossly intact in extremities.  Symmetrical.  Speech is fluent.  Psychiatric: Judgment intact, Mood & affect appropriate for pt's clinical situation. Dermatologic: No rashes or ulcers noted.  No cellulitis or open wounds.    Radiology No results found.  Labs No results found for this or any previous visit (from the past 2160 hour(s)).  Assessment/Plan:  Hyperlipidemia LDL goal <70 lipid control important in reducing the progression of atherosclerotic disease. Continue statin therapy   Diabetes type 2, controlled blood glucose control  important in reducing the progression of atherosclerotic disease. Also, involved in wound healing. On appropriate medications.   Essential hypertension blood pressure control important in reducing the progression of atherosclerotic disease. On appropriate oral medications.   Varicose veins with pain   The patient has symptoms consistent with chronic venous insufficiency. We discussed the natural history and treatment options for venous disease. I recommended the regular use of 20 - 30 mm Hg compression stockings, and prescribed these today. I recommended leg elevation and anti-inflammatories as needed for pain. I have also recommended a  complete venous duplex to assess the venous system for reflux or thrombotic issues. This can be done at the patient's convenience.  Says she currently has a lot of appointments for the rest of the summer and will call us back if her legs start bothering her worse.  We will go ahead and order the study and that can be done at her convenience if she decides to have it done.     Leotis Pain 10/30/2017, 2:21 PM   This note was created with Dragon medical transcription system.  Any errors from dictation are unintentional.

## 2017-10-30 NOTE — Patient Instructions (Signed)
Varicose Veins Varicose veins are veins that have become enlarged and twisted. They are usually seen in the legs but can occur in other parts of the body as well. What are the causes? This condition is the result of valves in the veins not working properly. Valves in the veins help to return blood from the leg to the heart. If these valves are damaged, blood flows backward and backs up into the veins in the leg near the skin. This causes the veins to become larger. What increases the risk? People who are on their feet a lot, who are pregnant, or who are overweight are more likely to develop varicose veins. What are the signs or symptoms?  Bulging, twisted-appearing, bluish veins, most commonly found on the legs.  Leg pain or a feeling of heaviness. These symptoms may be worse at the end of the day.  Leg swelling.  Changes in skin color. How is this diagnosed? A health care provider can usually diagnose varicose veins by examining your legs. Your health care provider may also recommend an ultrasound of your leg veins. How is this treated? Most varicose veins can be treated at home.However, other treatments are available for people who have persistent symptoms or want to improve the cosmetic appearance of the varicose veins. These treatment options include:  Sclerotherapy. A solution is injected into the vein to close it off.  Laser treatment. A laser is used to heat the vein to close it off.  Radiofrequency vein ablation. An electrical current produced by radio waves is used to close off the vein.  Phlebectomy. The vein is surgically removed through small incisions made over the varicose vein.  Vein ligation and stripping. The vein is surgically removed through incisions made over the varicose vein after the vein has been tied (ligated). Follow these instructions at home:   Do not stand or sit in one position for long periods of time. Do not sit with your legs crossed. Rest with your  legs raised during the day.  Wear compression stockings as directed by your health care provider. These stockings help to prevent blood clots and reduce swelling in your legs.  Do not wear other tight, encircling garments around your legs, pelvis, or waist.  Walk as much as possible to increase blood flow.  Raise the foot of your bed at night with 2-inch blocks.  If you get a cut in the skin over the vein and the vein bleeds, lie down with your leg raised and press on it with a clean cloth until the bleeding stops. Then place a bandage (dressing) on the cut. See your health care provider if it continues to bleed. Contact a health care provider if:  The skin around your ankle starts to break down.  You have pain, redness, tenderness, or hard swelling in your leg over a vein.  You are uncomfortable because of leg pain. This information is not intended to replace advice given to you by your health care provider. Make sure you discuss any questions you have with your health care provider. Document Released: 01/18/2005 Document Revised: 09/16/2015 Document Reviewed: 10/12/2015 Elsevier Interactive Patient Education  2017 Elsevier Inc.  

## 2017-11-05 ENCOUNTER — Ambulatory Visit
Admission: RE | Admit: 2017-11-05 | Discharge: 2017-11-05 | Disposition: A | Discharge status: Home / Self Care | Payer: Medicare HMO | Source: Ambulatory Visit | Attending: Internal Medicine | Admitting: Internal Medicine

## 2017-11-05 DIAGNOSIS — Z1231 Encounter for screening mammogram for malignant neoplasm of breast: Secondary | ICD-10-CM | POA: Diagnosis present

## 2017-11-06 ENCOUNTER — Other Ambulatory Visit: Payer: Self-pay | Admitting: Internal Medicine

## 2017-11-06 DIAGNOSIS — N6489 Other specified disorders of breast: Secondary | ICD-10-CM

## 2017-11-06 DIAGNOSIS — R928 Other abnormal and inconclusive findings on diagnostic imaging of breast: Secondary | ICD-10-CM

## 2017-11-19 ENCOUNTER — Ambulatory Visit
Admission: RE | Admit: 2017-11-19 | Discharge: 2017-11-19 | Disposition: A | Discharge status: Home / Self Care | Payer: Medicare HMO | Source: Ambulatory Visit | Attending: Internal Medicine | Admitting: Internal Medicine

## 2017-11-19 DIAGNOSIS — N6489 Other specified disorders of breast: Secondary | ICD-10-CM

## 2017-11-19 DIAGNOSIS — R928 Other abnormal and inconclusive findings on diagnostic imaging of breast: Secondary | ICD-10-CM | POA: Insufficient documentation

## 2019-01-10 ENCOUNTER — Other Ambulatory Visit: Payer: Self-pay | Admitting: Internal Medicine

## 2019-01-10 DIAGNOSIS — Z1231 Encounter for screening mammogram for malignant neoplasm of breast: Secondary | ICD-10-CM

## 2019-02-14 ENCOUNTER — Ambulatory Visit
Admission: RE | Admit: 2019-02-14 | Discharge: 2019-02-14 | Disposition: A | Discharge status: Home / Self Care | Payer: Medicare HMO | Source: Ambulatory Visit | Attending: Internal Medicine | Admitting: Internal Medicine

## 2019-02-14 ENCOUNTER — Other Ambulatory Visit: Payer: Self-pay

## 2019-02-14 DIAGNOSIS — Z1231 Encounter for screening mammogram for malignant neoplasm of breast: Secondary | ICD-10-CM | POA: Insufficient documentation

## 2019-08-28 ENCOUNTER — Ambulatory Visit (INDEPENDENT_AMBULATORY_CARE_PROVIDER_SITE_OTHER): Payer: Medicare HMO | Admitting: Obstetrics & Gynecology

## 2019-08-28 ENCOUNTER — Other Ambulatory Visit: Payer: Self-pay

## 2019-08-28 ENCOUNTER — Encounter: Payer: Self-pay | Admitting: Obstetrics & Gynecology

## 2019-08-28 VITALS — BP 140/80 | Ht 66.0 in | Wt 155.0 lb

## 2019-08-28 DIAGNOSIS — N814 Uterovaginal prolapse, unspecified: Secondary | ICD-10-CM | POA: Insufficient documentation

## 2019-08-28 NOTE — Progress Notes (Signed)
Consulting MD: Dr Earnest Bailey Reason: Pelvic pressure  Patient complains of a vaginal pressure like her uterus has dropped and she feels pressure bu no pain. Problem started a few months ago. Symptoms include: prolapse of tissue with straining and as above; no pain, no urgency frequency nocturia or incontinence.. Symptoms have been intermittent.    PMHx: She  has a past medical history of Bell's palsy (1985), CVA (cerebral infarction), Diabetes mellitus without complication ([place]), Hypertension, and Vertigo. Also,  has a past surgical history that includes Breast surgery; Tonsillectomy and adenoidectomy; thyroid nodules bx (Bilateral, 10/2015); Cataract extraction (Bilateral, 02/2016); and Breast excisional biopsy (Right, 1964)., family history includes Cancer in her mother; Diabetes in her father; Lung cancer in her brother.,  reports that she has never smoked. She has never used smokeless tobacco. She reports that she does not drink alcohol or use drugs.  She has a current medication list which includes the following prescription(s): atorvastatin, clopidogrel, metformin, metoprolol tartrate, co q 10, naproxen sod-diphenhydramine, and OVER THE COUNTER MEDICATION. Also, has No Known Allergies.  Review of Systems  Constitutional: Negative for chills, fever and malaise/fatigue.  HENT: Negative for congestion, sinus pain and sore throat.   Eyes: Negative for blurred vision and pain.  Respiratory: Negative for cough and wheezing.   Cardiovascular: Negative for chest pain and leg swelling.  Gastrointestinal: Negative for abdominal pain, constipation, diarrhea, heartburn, nausea and vomiting.  Genitourinary: Negative for dysuria, frequency, hematuria and urgency.  Musculoskeletal: Negative for back pain, joint pain, myalgias and neck pain.  Skin: Negative for itching and rash.  Neurological: Positive for dizziness. Negative for tremors and weakness.  Endo/Heme/Allergies: Does not bruise/bleed easily.    Psychiatric/Behavioral: Negative for depression. The patient is not nervous/anxious and does not have insomnia.     Objective: BP 140/80   Ht 5\' 6"  (1.676 m)   Wt 155 lb (70.3 kg)   BMI 25.02 kg/m  Physical Exam Constitutional:      General: She is not in acute distress.    Appearance: She is well-developed.  Genitourinary:     Pelvic exam was performed with patient supine.     Vagina, uterus and rectum normal.     No lesions in the vagina.     No vaginal bleeding.     No cervical motion tenderness, friability, lesion or polyp.     Uterus is mobile.     Uterus is not enlarged.     No uterine mass detected.    Uterus is midaxial.     No right or left adnexal mass present.     Right adnexa not tender.     Left adnexa not tender.  HENT:     Head: Normocephalic and atraumatic. No laceration.     Right Ear: Hearing normal.     Left Ear: Hearing normal.     Mouth/Throat:     Pharynx: Uvula midline.  Eyes:     Pupils: Pupils are equal, round, and reactive to light.  Neck:     Thyroid: No thyromegaly.  Cardiovascular:     Rate and Rhythm: Normal rate and regular rhythm.     Heart sounds: No murmur. No friction rub. No gallop.   Pulmonary:     Effort: Pulmonary effort is normal. No respiratory distress.     Breath sounds: Normal breath sounds. No wheezing.  Abdominal:     General: Bowel sounds are normal. There is no distension.     Palpations: Abdomen is soft.  Tenderness: There is no abdominal tenderness. There is no rebound.  Musculoskeletal:        General: Normal range of motion.     Cervical back: Normal range of motion and neck supple.  Neurological:     Mental Status: She is alert and oriented to person, place, and time.     Cranial Nerves: No cranial nerve deficit.  Skin:    General: Skin is warm and dry.  Psychiatric:        Judgment: Judgment normal.  Vitals reviewed.     ASSESSMENT/PLAN:   Problem List Items Addressed This Visit       Genitourinary   Uterine prolapse - Primary    Discussed all options for management esp if sx's worsen, as they are mild ad not at risk at this time.  Hysterectomy w AR, Pessary. Pros and cons discussed. Info given.  Rec expectant management at this time.  Pt aware to call w worsening sx's.  Barnett Applebaum, MD, [facility], [facility] 08/28/2019  10:15 AM

## 2019-08-28 NOTE — Patient Instructions (Signed)
Pelvic Organ Prolapse Pelvic organ prolapse is the stretching, bulging, or dropping of pelvic organs into an abnormal position. It happens when the muscles and tissues that surround and support pelvic structures become weak or stretched. Pelvic organ prolapse can involve the:  Vagina (vaginal prolapse).  Uterus (uterine prolapse).  Bladder (cystocele).  Rectum (rectocele).  Intestines (enterocele). When organs other than the vagina are involved, they often bulge into the vagina or protrude from the vagina, depending on how severe the prolapse is. What are the causes? This condition may be caused by:  Pregnancy, labor, and childbirth.  Past pelvic surgery.  Decreased production of the hormone estrogen associated with menopause.  Consistently lifting more than 50 lb (23 kg).  Obesity.  Long-term inability to pass stool (chronic constipation).  A cough that lasts a long time (chronic).  Buildup of fluid in the abdomen due to certain diseases and other conditions. What are the signs or symptoms? Symptoms of this condition include:  Passing a little urine (loss of bladder control) when you cough, sneeze, strain, and exercise (stress incontinence). This may be worse immediately after childbirth. It may gradually improve over time.  Feeling pressure in your pelvis or vagina. This pressure may increase when you cough or when you are passing stool.  A bulge that protrudes from the opening of your vagina.  Difficulty passing urine or stool.  Pain in your lower back.  Pain, discomfort, or disinterest in sex.  Repeated bladder infections (urinary tract infections).  Difficulty inserting a tampon. In some people, this condition causes no symptoms. How is this diagnosed? This condition may be diagnosed based on a vaginal and rectal exam. During the exam, you may be asked to cough and strain while you are lying down, sitting, and standing up. Your health care provider will  determine if other tests are required, such as bladder function tests. How is this treated? Treatment for this condition may depend on your symptoms. Treatment may include:  Lifestyle changes, such as changes to your diet.  Emptying your bladder at scheduled times (bladder training therapy). This can help reduce or avoid urinary incontinence.  Estrogen. Estrogen may help mild prolapse by increasing the strength and tone of pelvic floor muscles.  Kegel exercises. These may help mild cases of prolapse by strengthening and tightening the muscles of the pelvic floor.  A soft, flexible device that helps support the vaginal walls and keep pelvic organs in place (pessary). This is inserted into your vagina by your health care provider.  Surgery. This is often the only form of treatment for severe prolapse. Follow these instructions at home:  Avoid drinking beverages that contain caffeine or alcohol.  Increase your intake of high-fiber foods. This can help decrease constipation and straining during bowel movements.  Lose weight if recommended by your health care provider.  Wear a sanitary pad or adult diapers if you have urinary incontinence.  Avoid heavy lifting and straining with exercise and work. Do not hold your breath when you perform mild to moderate lifting and exercise activities. Limit your activities as directed by your health care provider.  Do Kegel exercises as directed by your health care provider. To do this: ? Squeeze your pelvic floor muscles tight. You should feel a tight lift in your rectal area and a tightness in your vaginal area. Keep your stomach, buttocks, and legs relaxed. ? Hold the muscles tight for up to 10 seconds. ? Relax your muscles. ? Repeat this exercise 50 times a day,   or as many times as told by your health care provider. Continue to do this exercise for at least 4-6 weeks, or for as long as told by your health care provider.  Take over-the-counter and  prescription medicines only as told by your health care provider.  If you have a pessary, take care of it as told by your health care provider.  Keep all follow-up visits as told by your health care provider. This is important. Contact a health care provider if you:  Have symptoms that interfere with your daily activities or sex life.  Need medicine to help with the discomfort.  Notice bleeding from your vagina that is not related to your period.  Have a fever.  Have pain or bleeding when you urinate.  Have bleeding when you pass stool.  Pass urine when you have sex.  Have chronic constipation.  Have a pessary that falls out.  Have bad smelling vaginal discharge.  Have an unusual, low pain in your abdomen. Summary  Pelvic organ prolapse is the stretching, bulging, or dropping of pelvic organs into an abnormal position. It happens when the muscles and tissues that surround and support pelvic structures become weak or stretched.  When organs other than the vagina are involved, they often bulge into the vagina or protrude from the vagina, depending on how severe the prolapse is.  In most cases, this condition needs to be treated only if it produces symptoms. Treatment may include lifestyle changes, estrogen, Kegel exercises, pessary insertion, or surgery.  Avoid heavy lifting and straining with exercise and work. Do not hold your breath when you perform mild to moderate lifting and exercise activities. Limit your activities as directed by your health care provider. This information is not intended to replace advice given to you by your health care provider. Make sure you discuss any questions you have with your health care provider. Document Revised: 05/02/2017 Document Reviewed: 05/02/2017 Elsevier Patient Education  2020 Elsevier Inc.  

## 2019-09-16 ENCOUNTER — Telehealth: Payer: Self-pay

## 2019-09-16 NOTE — Telephone Encounter (Signed)
Pts daughter Lattie Haw (on [place]) wants to let you know that her mom has been having hallucinations since she got her COVID vaccines. They are coming in for an appt 5/28 and she just wanted to make you aware. Mentions her mom may or may not let you know of these hallucinations.

## 2019-09-18 ENCOUNTER — Emergency Department
Admission: EM | Admit: 2019-09-18 | Discharge: 2019-09-18 | Disposition: A | Discharge status: Home / Self Care | Payer: Medicare HMO | Attending: Emergency Medicine | Admitting: Emergency Medicine

## 2019-09-18 ENCOUNTER — Other Ambulatory Visit: Payer: Self-pay

## 2019-09-18 DIAGNOSIS — Z5321 Procedure and treatment not carried out due to patient leaving prior to being seen by health care provider: Secondary | ICD-10-CM | POA: Insufficient documentation

## 2019-09-18 DIAGNOSIS — R441 Visual hallucinations: Secondary | ICD-10-CM | POA: Insufficient documentation

## 2019-09-18 LAB — COMPREHENSIVE METABOLIC PANEL
ALT: 16 U/L (ref 0–44)
AST: 17 U/L (ref 15–41)
Albumin: 4.1 g/dL (ref 3.5–5.0)
Alkaline Phosphatase: 62 U/L (ref 38–126)
Anion gap: 9 (ref 5–15)
BUN: 22 mg/dL (ref 8–23)
CO2: 27 mmol/L (ref 22–32)
Calcium: 9.1 mg/dL (ref 8.9–10.3)
Chloride: 105 mmol/L (ref 98–111)
Creatinine, Ser: 1.02 mg/dL — ABNORMAL HIGH (ref 0.44–1.00)
GFR calc Af Amer: 59 mL/min — ABNORMAL LOW (ref >60)
GFR calc non Af Amer: 51 mL/min — ABNORMAL LOW (ref >60)
Glucose, Bld: 173 mg/dL — ABNORMAL HIGH (ref 70–99)
Potassium: 3.8 mmol/L (ref 3.5–5.1)
Sodium: 141 mmol/L (ref 135–145)
Total Bilirubin: 0.9 mg/dL (ref 0.3–1.2)
Total Protein: 6.9 g/dL (ref 6.5–8.1)

## 2019-09-18 LAB — URINE DRUG SCREEN, QUALITATIVE (ARMC ONLY)
Amphetamines, Ur Screen: NOT DETECTED
Barbiturates, Ur Screen: NOT DETECTED
Benzodiazepine, Ur Scrn: NOT DETECTED
Cannabinoid 50 Ng, Ur ~~LOC~~: NOT DETECTED
Cocaine Metabolite,Ur ~~LOC~~: NOT DETECTED
MDMA (Ecstasy)Ur Screen: NOT DETECTED
Methadone Scn, Ur: NOT DETECTED
Opiate, Ur Screen: NOT DETECTED
Phencyclidine (PCP) Ur S: NOT DETECTED
Tricyclic, Ur Screen: NOT DETECTED

## 2019-09-18 LAB — CBC
HCT: 37.8 % (ref 36.0–46.0)
Hemoglobin: 12.2 g/dL (ref 12.0–15.0)
MCH: 29.3 pg (ref 26.0–34.0)
MCHC: 32.3 g/dL (ref 30.0–36.0)
MCV: 90.6 fL (ref 80.0–100.0)
Platelets: 183 10*3/uL (ref 150–400)
RBC: 4.17 MIL/uL (ref 3.87–5.11)
RDW: 13 % (ref 11.5–15.5)
WBC: 8.3 10*3/uL (ref 4.0–10.5)
nRBC: 0 % (ref 0.0–0.2)

## 2019-09-18 LAB — URINALYSIS, COMPLETE (UACMP) WITH MICROSCOPIC
Bacteria, UA: NONE SEEN
Bilirubin Urine: NEGATIVE
Glucose, UA: NEGATIVE mg/dL
Ketones, ur: NEGATIVE mg/dL
Nitrite: NEGATIVE
Protein, ur: NEGATIVE mg/dL
Specific Gravity, Urine: 1.018 (ref 1.005–1.030)
pH: 5 (ref 5.0–8.0)

## 2019-09-18 LAB — ETHANOL: Alcohol, Ethyl (B): <10 mg/dL (ref <10)

## 2019-09-18 NOTE — ED Notes (Signed)
Pt refuses MRI at this time until she speaks with the ED provider

## 2019-09-18 NOTE — ED Triage Notes (Signed)
Pt states since January she has been having visual hallucinations, states started 7 days after the Covid voccine. States has been having these hallucinations at night and pt has video of the hallucinations on her phone. States tonight she felt them touch her across her abd and chest. Pt has no psych history or hx of the same.

## 2019-09-19 ENCOUNTER — Encounter: Payer: Self-pay | Admitting: Obstetrics & Gynecology

## 2019-09-19 ENCOUNTER — Other Ambulatory Visit: Payer: Self-pay | Admitting: Family Medicine

## 2019-09-19 ENCOUNTER — Ambulatory Visit (INDEPENDENT_AMBULATORY_CARE_PROVIDER_SITE_OTHER): Payer: Medicare HMO | Admitting: Obstetrics & Gynecology

## 2019-09-19 ENCOUNTER — Telehealth: Payer: Self-pay | Admitting: Family Medicine

## 2019-09-19 VITALS — BP 138/80 | Ht 66.0 in | Wt 154.0 lb

## 2019-09-19 DIAGNOSIS — N814 Uterovaginal prolapse, unspecified: Secondary | ICD-10-CM

## 2019-09-19 DIAGNOSIS — R441 Visual hallucinations: Secondary | ICD-10-CM

## 2019-09-19 DIAGNOSIS — G4700 Insomnia, unspecified: Secondary | ICD-10-CM

## 2019-09-19 NOTE — Telephone Encounter (Signed)
09/19/19~Patient REFUSED appt, per patient, will f/u w ref MD after Rx completed.Holly [name] MF

## 2019-09-19 NOTE — Progress Notes (Signed)
  History of Present Illness:  Holly [name] is a 84 y.o. who was seen and examined for uterine prolapse a few weeks ago.  She had been feeling mild vaginal pressure.  She did not have urinary sx's.  Recently she has noted some abdominal pains.  Her PCP has recommended a MRI.  pT here to discuss whether the sx's could be uterine related and if a [place] would be the study she needs.  No n/v/d.  No VB.  No urinary sx's, denies LOU or urinary retention, denies freq, urgency, nocturia.  PMHx: She  has a past medical history of Bell's palsy (1985), CVA (cerebral infarction), Diabetes mellitus without complication ([place]), Hypertension, and Vertigo. Also,  has a past surgical history that includes Breast surgery; Tonsillectomy and adenoidectomy; thyroid nodules bx (Bilateral, 10/2015); Cataract extraction (Bilateral, 02/2016); and Breast excisional biopsy (Right, 1964)., family history includes Cancer in her mother; Diabetes in her father; Lung cancer in her brother.,  reports that she has never smoked. She has never used smokeless tobacco. She reports that she does not drink alcohol or use drugs. Current Meds  Medication Sig  . atorvastatin (LIPITOR) 40 MG tablet TAKE 1 TABLET BY MOUTH EVERY EVENING  . clopidogrel (PLAVIX) 75 MG tablet TAKE 1 TABLET BY MOUTH EVERY DAY  . Coenzyme Q10 (CO Q 10) 10 MG CAPS Take 10 mg by mouth daily.  . metFORMIN (GLUCOPHAGE) 500 MG tablet Take 500 mg by mouth 2 (two) times daily with a meal.  . metoprolol (LOPRESSOR) 50 MG tablet Take 50 mg by mouth. Take one tablet once daily/patient reported  . Naproxen Sod-Diphenhydramine (ALEVE PM PO) Take by mouth as needed.  Marland Kitchen OVER THE COUNTER MEDICATION Healthy Hair skin and Nails.  One daily  . Also, has No Known Allergies..  Review of Systems  Gastrointestinal: Positive for abdominal pain.  All other systems reviewed and are negative.   Physical Exam:  BP 138/80   Ht 5\' 6"  (1.676 m)   Wt 154 lb (69.9 kg)   BMI 24.86 kg/m   Body mass index is 24.86 kg/m. Constitutional: Well nourished, well developed female in no acute distress.  Psych:  Normal mood and affect.   Recent exam, not repeated today  Assessment:  Problem List Items Addressed This Visit      Genitourinary   Uterine prolapse - Primary     Plan: Discussed pain and options for investigation.  I feel she needs to follow the recommendation of her PCP.  Uterine prolapse unlikely to be the source of her abdominal pain, without vaginal pain or worsening prolapse reported and no GU sx's.  MRI would be more encompassing than [place].  Cont to monitor for s/sx worsening uterine prolapse or urinary symptoms such as urinary retention, urgency, frequency, or incontinence.  She was amenable to this plan and we will see her back for annual/PRN.  A total of 15 minutes were spent face-to-face with the patient as well as preparation, review, communication, and documentation during this encounter.   Barnett Applebaum, MD, [facility], [facility] 09/19/2019  3:04 PM

## 2019-09-20 DIAGNOSIS — R441 Visual hallucinations: Secondary | ICD-10-CM | POA: Insufficient documentation

## 2019-09-27 ENCOUNTER — Encounter: Payer: Self-pay | Admitting: Emergency Medicine

## 2019-09-27 ENCOUNTER — Other Ambulatory Visit: Payer: Self-pay

## 2019-09-27 ENCOUNTER — Emergency Department: Payer: Medicare HMO

## 2019-09-27 ENCOUNTER — Emergency Department
Admission: EM | Admit: 2019-09-27 | Discharge: 2019-09-27 | Disposition: A | Discharge status: Home / Self Care | Payer: Medicare HMO | Attending: Emergency Medicine | Admitting: Emergency Medicine

## 2019-09-27 DIAGNOSIS — Z79899 Other long term (current) drug therapy: Secondary | ICD-10-CM | POA: Diagnosis not present

## 2019-09-27 DIAGNOSIS — K2970 Gastritis, unspecified, without bleeding: Secondary | ICD-10-CM | POA: Diagnosis not present

## 2019-09-27 DIAGNOSIS — Z7984 Long term (current) use of oral hypoglycemic drugs: Secondary | ICD-10-CM | POA: Insufficient documentation

## 2019-09-27 DIAGNOSIS — G510 Bell's palsy: Secondary | ICD-10-CM | POA: Insufficient documentation

## 2019-09-27 DIAGNOSIS — I10 Essential (primary) hypertension: Secondary | ICD-10-CM | POA: Insufficient documentation

## 2019-09-27 DIAGNOSIS — E119 Type 2 diabetes mellitus without complications: Secondary | ICD-10-CM | POA: Insufficient documentation

## 2019-09-27 DIAGNOSIS — R0602 Shortness of breath: Secondary | ICD-10-CM | POA: Insufficient documentation

## 2019-09-27 LAB — COMPREHENSIVE METABOLIC PANEL
ALT: 15 U/L (ref 0–44)
AST: 14 U/L — ABNORMAL LOW (ref 15–41)
Albumin: 3.9 g/dL (ref 3.5–5.0)
Alkaline Phosphatase: 60 U/L (ref 38–126)
Anion gap: 9 (ref 5–15)
BUN: 28 mg/dL — ABNORMAL HIGH (ref 8–23)
CO2: 24 mmol/L (ref 22–32)
Calcium: 8.8 mg/dL — ABNORMAL LOW (ref 8.9–10.3)
Chloride: 107 mmol/L (ref 98–111)
Creatinine, Ser: 0.78 mg/dL (ref 0.44–1.00)
GFR calc Af Amer: >60 mL/min (ref >60)
GFR calc non Af Amer: >60 mL/min (ref >60)
Glucose, Bld: 194 mg/dL — ABNORMAL HIGH (ref 70–99)
Potassium: 3.5 mmol/L (ref 3.5–5.1)
Sodium: 140 mmol/L (ref 135–145)
Total Bilirubin: 0.8 mg/dL (ref 0.3–1.2)
Total Protein: 6.5 g/dL (ref 6.5–8.1)

## 2019-09-27 LAB — DIFFERENTIAL
Abs Immature Granulocytes: 0.03 10*3/uL (ref 0.00–0.07)
Basophils Absolute: 0 10*3/uL (ref 0.0–0.1)
Basophils Relative: 1 %
Eosinophils Absolute: 0.1 10*3/uL (ref 0.0–0.5)
Eosinophils Relative: 1 %
Immature Granulocytes: 0 %
Lymphocytes Relative: 18 %
Lymphs Abs: 1.3 10*3/uL (ref 0.7–4.0)
Monocytes Absolute: 0.4 10*3/uL (ref 0.1–1.0)
Monocytes Relative: 5 %
Neutro Abs: 5.1 10*3/uL (ref 1.7–7.7)
Neutrophils Relative %: 75 %

## 2019-09-27 LAB — PROTIME-INR
INR: 1 (ref 0.8–1.2)
Prothrombin Time: 12.7 seconds (ref 11.4–15.2)

## 2019-09-27 LAB — CBC
HCT: 35.8 % — ABNORMAL LOW (ref 36.0–46.0)
Hemoglobin: 11.6 g/dL — ABNORMAL LOW (ref 12.0–15.0)
MCH: 29.3 pg (ref 26.0–34.0)
MCHC: 32.4 g/dL (ref 30.0–36.0)
MCV: 90.4 fL (ref 80.0–100.0)
Platelets: 185 10*3/uL (ref 150–400)
RBC: 3.96 MIL/uL (ref 3.87–5.11)
RDW: 13 % (ref 11.5–15.5)
WBC: 6.9 10*3/uL (ref 4.0–10.5)
nRBC: 0 % (ref 0.0–0.2)

## 2019-09-27 LAB — APTT: aPTT: 28 seconds (ref 24–36)

## 2019-09-27 LAB — TROPONIN I (HIGH SENSITIVITY): Troponin I (High Sensitivity): 3 ng/L (ref <18)

## 2019-09-27 MED ORDER — LIDOCAINE VISCOUS HCL 2 % MT SOLN
15.0000 mL | Freq: Once | OROMUCOSAL | Status: AC
  Administered 2019-09-27: 15 mL via OROMUCOSAL
  Filled 2019-09-27: qty 15

## 2019-09-27 MED ORDER — FAMOTIDINE 20 MG PO TABS
20.0000 mg | ORAL_TABLET | Freq: Every day | ORAL | 1 refills | Status: DC
Start: 2019-09-27 — End: 2021-10-26

## 2019-09-27 MED ORDER — SODIUM CHLORIDE 0.9% FLUSH: 3.0000 mL | Freq: Once | INTRAVENOUS | Status: DC

## 2019-09-27 NOTE — ED Triage Notes (Signed)
Pt to ED via POV c/o dizziness and shortness of breath. Pt states that she has also been having visual hallucinations. Pt states that her PCP recommended that she have an MRI. Pt seen here for same on 5/27. Pt is A & O at this time in NAD.

## 2019-09-27 NOTE — ED Notes (Signed)
This RN obtained repeat VS. Pt denies any pain at this time. Pt A&O x4. This RN apologized for wait repeatedly. Pt asking about wait times, this RN explained acuity. Pt states understanding. Pt visualized in NAD at this time sitting in waiting room.

## 2019-09-27 NOTE — ED Provider Notes (Signed)
[facility] Emergency Department Provider Note  ____________________________________________   I have reviewed the triage vital signs and the nursing notes.   HISTORY  Chief Complaint Shortness of breath  History limited by: Not Limited   HPI Holly [name] is a 84 y.o. female who presents to the emergency department today with concern for shortness of breath.  The patient states that her symptoms started yesterday night.  Patient describes a sense of an inability to get a full breath.  She has some discomfort in the center of her chest.  She states she did drink some water and that helped.  Patient denies any cough.  She denies any fevers.  Patient does state that she has had some dizziness on and off although she has had these episodes before and attributes it to vertigo.  Records reviewed. Per medical record review patient has a history of bell's palsy.   Past Medical History:  Diagnosis Date  . Bell's palsy 1985  . CVA (cerebral infarction)   . Diabetes mellitus without complication (HCC)    non insulin dependent  . Hypertension   . Vertigo     Patient Active Problem List   Diagnosis Date Noted  . Uterine prolapse 08/28/2019  . Varicose veins with pain 10/30/2017  . Cataract cortical, senile 05/10/2015  . Bell palsy 05/10/2015  . Cerebral embolism with cerebral infarction ([place]) 11/06/2014  . Diabetes type 2, controlled ([place]) 09/01/2014  . Hyperlipidemia LDL goal <70 09/01/2014  . Cerebral thrombosis with cerebral infarction ([place]) 08/29/2014  . First degree AV block 08/28/2014  . Bradycardia 08/28/2014  . 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 Essential hypertension     Past Surgical History:  Procedure Laterality Date  . BREAST EXCISIONAL BIOPSY Right 1964   neg  . BREAST SURGERY     Benign tumor  . CATARACT EXTRACTION Bilateral 02/2016  . thyroid nodules bx Bilateral 10/2015    [facility], Dr. Gabriel Carina  (negative)  .  TONSILLECTOMY AND ADENOIDECTOMY      Prior to Admission medications   Medication Sig Start Date End Date Taking? Authorizing Provider  atorvastatin (LIPITOR) 40 MG tablet TAKE 1 TABLET BY MOUTH EVERY EVENING 03/19/17   Marcial Pacas, MD  clopidogrel (PLAVIX) 75 MG tablet TAKE 1 TABLET BY MOUTH EVERY DAY 06/08/16   Marcial Pacas, MD  Coenzyme Q10 (CO Q 10) 10 MG CAPS Take 10 mg by mouth daily.    [provider]  metFORMIN (GLUCOPHAGE) 500 MG tablet Take 500 mg by mouth 2 (two) times daily with a meal.    [provider]  metoprolol (LOPRESSOR) 50 MG tablet Take 50 mg by mouth. Take one tablet once daily/patient reported    [provider]  Naproxen Sod-Diphenhydramine (ALEVE PM PO) Take by mouth as needed.    [provider]  OVER THE COUNTER MEDICATION Healthy Hair skin and Nails.  One daily    [provider]    Allergies Patient has no known allergies.  Family History  Problem Relation Age of Onset  . Cancer Mother        Marena Chancy of type - "female"  . Diabetes Father   . Lung cancer Brother     Social History Social History   Tobacco Use  . Smoking status: Never Smoker  . Smokeless tobacco: Never Used  Substance Use Topics  . Alcohol use: No  . Drug use: No    Review of Systems Constitutional: No fever/chills Eyes: No  visual changes. ENT: No sore throat. Cardiovascular: Positive for chest discomfort. Respiratory: Positive for shortness of breath. Gastrointestinal: No abdominal pain.  No nausea, no vomiting.  No diarrhea.   Genitourinary: Negative for dysuria. Musculoskeletal: Negative for back pain. Skin: Negative for rash. Neurological: Negative for headaches, focal weakness or numbness.  ____________________________________________   PHYSICAL EXAM:  VITAL SIGNS: ED Triage Vitals  Enc Vitals Group     BP 09/27/19 1014 (!) 144/68     Pulse Rate 09/27/19 1014 73     Resp 09/27/19 1014 16     Temp 09/27/19 1014 98.7 F  (37.1 C)     Temp Source 09/27/19 1014 Oral     SpO2 09/27/19 1014 98 %     Weight --      Height --      Head Circumference --      Peak Flow --      Pain Score 09/27/19 1034 0   Constitutional: Alert and oriented.  Eyes: Conjunctivae are normal.  ENT      Head: Normocephalic and atraumatic.      Nose: No congestion/rhinnorhea.      Mouth/Throat: Mucous membranes are moist.      Neck: No stridor. Hematological/Lymphatic/Immunilogical: No cervical lymphadenopathy. Cardiovascular: Normal rate, regular rhythm.  No murmurs, rubs, or gallops.  Respiratory: Normal respiratory effort without tachypnea nor retractions. Breath sounds are clear and equal bilaterally. No wheezes/rales/rhonchi. Gastrointestinal: Soft and non tender. No rebound. No guarding.  Genitourinary: Deferred Musculoskeletal: Normal range of motion in all extremities. No lower extremity edema. Neurologic:  Normal speech and language. Some weakness to the right side of the face (pt states this is baseline since she had bell's palsy) Skin:  Skin is warm, dry and intact. No rash noted. Psychiatric: Mood and affect are normal. Speech and behavior are normal. Patient exhibits appropriate insight and judgment.  ____________________________________________    LABS (pertinent positives/negatives)  CMP na 140, k 3.5, glu 194, cr 0.78 CBC wbc 6.9, hgb 11.6, plt 185 Trop hs 3 ____________________________________________   EKG  I, Nance Pear, attending physician, personally viewed and interpreted this EKG  EKG Time: 1035 Rate: 66 Rhythm: sinus rhythm with 1st degree av block Axis: normal Intervals: qtc 434 QRS: narrow ST changes: no st elevation Impression: abnormal ekg  ____________________________________________    RADIOLOGY  CXR No acute cardiopulmonary disease  ____________________________________________   PROCEDURES  Procedures  ____________________________________________   INITIAL  IMPRESSION / ASSESSMENT AND PLAN / ED COURSE  Pertinent labs & imaging results that were available during my care of the patient were reviewed by me and considered in my medical decision making (see chart for details).   Patient presented to the emergency department today with primary concerns for shortness of breath.  She does describe discomfort in her chest and felt like she cannot get a full deep breath.  On exam patient without any respiratory distress.  Lungs were clear to auscultation.  EKG without ST elevation.  Troponin was negative.  Chest x-ray without pneumonia or pneumothorax.  She stated she did feel somewhat better after drinking water.  Because of this I did try viscous lidocaine which she said to continue to improve her symptoms.  At this point I do wonder patient is 7 suffering from gastritis and acid reflux. I doubt cardiac or pulmonary etiology given negative work up. Doubt PE/dissection.  I discussed this with the patient.  Will give patient prescription for an acid. ____________________________________________   FINAL CLINICAL IMPRESSION(S) /  ED DIAGNOSES  Final diagnoses:  Shortness of breath  Gastritis, presence of bleeding unspecified, unspecified chronicity, unspecified gastritis type     Note: This dictation was prepared with Dragon dictation. Any transcriptional errors that result from this process are unintentional     Nance Pear, MD 09/27/19 1727

## 2019-09-27 NOTE — Discharge Instructions (Addendum)
Please seek medical attention for any high fevers, chest pain, shortness of breath, change in behavior, persistent vomiting, bloody stool or any other new or concerning symptoms.  

## 2019-09-29 ENCOUNTER — Emergency Department (HOSPITAL_COMMUNITY)
Admission: EM | Admit: 2019-09-29 | Discharge: 2019-09-29 | Discharge status: Against Medical Advice | Payer: Medicare HMO

## 2019-09-29 NOTE — ED Notes (Signed)
Called twice, no answer

## 2019-09-29 NOTE — ED Notes (Signed)
Called for pt, no answer

## 2019-10-06 ENCOUNTER — Other Ambulatory Visit: Payer: Self-pay

## 2019-10-06 ENCOUNTER — Emergency Department: Payer: Medicare HMO

## 2019-10-06 ENCOUNTER — Emergency Department
Admission: EM | Admit: 2019-10-06 | Discharge: 2019-10-07 | Disposition: A | Discharge status: Other Acute Inpatient Hospital | Payer: Medicare HMO | Attending: Emergency Medicine | Admitting: Emergency Medicine

## 2019-10-06 DIAGNOSIS — Z8673 Personal history of transient ischemic attack (TIA), and cerebral infarction without residual deficits: Secondary | ICD-10-CM | POA: Insufficient documentation

## 2019-10-06 DIAGNOSIS — Z20822 Contact with and (suspected) exposure to covid-19: Secondary | ICD-10-CM | POA: Diagnosis not present

## 2019-10-06 DIAGNOSIS — Z7902 Long term (current) use of antithrombotics/antiplatelets: Secondary | ICD-10-CM | POA: Insufficient documentation

## 2019-10-06 DIAGNOSIS — E119 Type 2 diabetes mellitus without complications: Secondary | ICD-10-CM | POA: Diagnosis not present

## 2019-10-06 DIAGNOSIS — I10 Essential (primary) hypertension: Secondary | ICD-10-CM | POA: Insufficient documentation

## 2019-10-06 DIAGNOSIS — Z7984 Long term (current) use of oral hypoglycemic drugs: Secondary | ICD-10-CM | POA: Insufficient documentation

## 2019-10-06 DIAGNOSIS — R443 Hallucinations, unspecified: Secondary | ICD-10-CM

## 2019-10-06 DIAGNOSIS — F29 Unspecified psychosis not due to a substance or known physiological condition: Secondary | ICD-10-CM | POA: Diagnosis not present

## 2019-10-06 DIAGNOSIS — R440 Auditory hallucinations: Secondary | ICD-10-CM | POA: Diagnosis present

## 2019-10-06 LAB — BASIC METABOLIC PANEL
Anion gap: 9 (ref 5–15)
BUN: 25 mg/dL — ABNORMAL HIGH (ref 8–23)
CO2: 26 mmol/L (ref 22–32)
Calcium: 8.7 mg/dL — ABNORMAL LOW (ref 8.9–10.3)
Chloride: 108 mmol/L (ref 98–111)
Creatinine, Ser: 0.9 mg/dL (ref 0.44–1.00)
GFR calc Af Amer: >60 mL/min (ref >60)
GFR calc non Af Amer: 60 mL/min — ABNORMAL LOW (ref >60)
Glucose, Bld: 161 mg/dL — ABNORMAL HIGH (ref 70–99)
Potassium: 3.3 mmol/L — ABNORMAL LOW (ref 3.5–5.1)
Sodium: 143 mmol/L (ref 135–145)

## 2019-10-06 LAB — CBC WITH DIFFERENTIAL/PLATELET
Abs Immature Granulocytes: 0.02 10*3/uL (ref 0.00–0.07)
Basophils Absolute: 0.1 10*3/uL (ref 0.0–0.1)
Basophils Relative: 1 %
Eosinophils Absolute: 0.1 10*3/uL (ref 0.0–0.5)
Eosinophils Relative: 1 %
HCT: 36.7 % (ref 36.0–46.0)
Hemoglobin: 11.8 g/dL — ABNORMAL LOW (ref 12.0–15.0)
Immature Granulocytes: 0 %
Lymphocytes Relative: 23 %
Lymphs Abs: 1.6 10*3/uL (ref 0.7–4.0)
MCH: 29.3 pg (ref 26.0–34.0)
MCHC: 32.2 g/dL (ref 30.0–36.0)
MCV: 91.1 fL (ref 80.0–100.0)
Monocytes Absolute: 0.6 10*3/uL (ref 0.1–1.0)
Monocytes Relative: 8 %
Neutro Abs: 4.7 10*3/uL (ref 1.7–7.7)
Neutrophils Relative %: 67 %
Platelets: 214 10*3/uL (ref 150–400)
RBC: 4.03 MIL/uL (ref 3.87–5.11)
RDW: 13 % (ref 11.5–15.5)
WBC: 7.1 10*3/uL (ref 4.0–10.5)
nRBC: 0 % (ref 0.0–0.2)

## 2019-10-06 LAB — URINALYSIS, COMPLETE (UACMP) WITH MICROSCOPIC
Bilirubin Urine: NEGATIVE
Glucose, UA: 150 mg/dL — AB
Ketones, ur: NEGATIVE mg/dL
Nitrite: NEGATIVE
Protein, ur: NEGATIVE mg/dL
Specific Gravity, Urine: 1.006 (ref 1.005–1.030)
pH: 6 (ref 5.0–8.0)

## 2019-10-06 LAB — SARS CORONAVIRUS 2 BY RT PCR (HOSPITAL ORDER, PERFORMED IN ~~LOC~~ HOSPITAL LAB): SARS Coronavirus 2: NEGATIVE

## 2019-10-06 LAB — TSH: TSH: 1.178 u[IU]/mL (ref 0.350–4.500)

## 2019-10-06 MED ORDER — METOPROLOL TARTRATE 50 MG PO TABS
50.0000 mg | ORAL_TABLET | Freq: Every day | ORAL | Status: DC
  Administered 2019-10-06 – 2019-10-07 (×2): 50 mg via ORAL
  Filled 2019-10-06 (×2): qty 2

## 2019-10-06 MED ORDER — METFORMIN HCL 500 MG PO TABS
500.0000 mg | ORAL_TABLET | Freq: Two times a day (BID) | ORAL | Status: DC
  Administered 2019-10-06 – 2019-10-07 (×2): 500 mg via ORAL
  Filled 2019-10-06 (×2): qty 1

## 2019-10-06 MED ORDER — DIPHENHYDRAMINE HCL 50 MG/ML IJ SOLN: 25.0000 mg | Freq: Once | INTRAMUSCULAR | Status: DC

## 2019-10-06 MED ORDER — CLOPIDOGREL BISULFATE 75 MG PO TABS
75.0000 mg | ORAL_TABLET | Freq: Every day | ORAL | Status: DC
  Administered 2019-10-06 – 2019-10-07 (×2): 75 mg via ORAL
  Filled 2019-10-06 (×2): qty 1

## 2019-10-06 MED ORDER — POTASSIUM CHLORIDE CRYS ER 20 MEQ PO TBCR
20.0000 meq | EXTENDED_RELEASE_TABLET | Freq: Once | ORAL | Status: AC
  Administered 2019-10-06: 20 meq via ORAL
  Filled 2019-10-06: qty 1

## 2019-10-06 MED ORDER — HALOPERIDOL 0.5 MG PO TABS
2.0000 mg | ORAL_TABLET | Freq: Every day | ORAL | Status: DC
  Administered 2019-10-06: 2 mg via ORAL
  Filled 2019-10-06 (×2): qty 4

## 2019-10-06 MED ORDER — CEPHALEXIN 500 MG PO CAPS
500.0000 mg | ORAL_CAPSULE | Freq: Two times a day (BID) | ORAL | Status: DC
  Administered 2019-10-06 – 2019-10-07 (×2): 500 mg via ORAL
  Filled 2019-10-06 (×4): qty 1

## 2019-10-06 MED ORDER — HALOPERIDOL 0.5 MG PO TABS
0.2500 mg | ORAL_TABLET | Freq: Every morning | ORAL | Status: DC
  Administered 2019-10-06: 0.25 mg via ORAL
  Filled 2019-10-06: qty 1

## 2019-10-06 MED ORDER — LORAZEPAM 0.5 MG PO TABS
0.5000 mg | ORAL_TABLET | Freq: Four times a day (QID) | ORAL | Status: DC | PRN
  Administered 2019-10-07: 0.5 mg via ORAL
  Filled 2019-10-06: qty 1

## 2019-10-06 MED ORDER — DIPHENHYDRAMINE HCL 50 MG/ML IJ SOLN
25.0000 mg | Freq: Once | INTRAMUSCULAR | Status: AC | PRN
  Administered 2019-10-07: 25 mg via INTRAMUSCULAR
  Filled 2019-10-06: qty 1

## 2019-10-06 MED ORDER — BENZTROPINE MESYLATE 1 MG PO TABS
0.5000 mg | ORAL_TABLET | Freq: Two times a day (BID) | ORAL | Status: DC
  Administered 2019-10-06 (×2): 0.5 mg via ORAL
  Filled 2019-10-06 (×3): qty 1

## 2019-10-06 MED ORDER — OLANZAPINE 10 MG IM SOLR
2.5000 mg | Freq: Once | INTRAMUSCULAR | Status: AC | PRN
  Administered 2019-10-07: 2.5 mg via INTRAMUSCULAR
  Filled 2019-10-06 (×2): qty 10

## 2019-10-06 NOTE — ED Notes (Signed)
Patient had covid swab, she was compliant and no signs of distress.

## 2019-10-06 NOTE — ED Notes (Signed)
Pt paranoid, having VH and asking staff if they see the same things. Anxious of the people watching her in the restroom. Pt assured no one there and that she is safe here. Pt observed pacing in dayroom while on phone.
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
## 2019-10-06 NOTE — ED Notes (Signed)
IVC/  PENDING  PLACEMENT 

## 2019-10-06 NOTE — BH Assessment (Signed)
Caryl Pina contacted TTS and reported pt to be under review. Caryl Pina requested for a copy of pt's chest x-ray, EKG, COVID, Urine analysis, drug screening, BAL and the results of pt's CT head recently completed.   TTS faxed over a copy of COVID results and CT Head results. TTS communicated with pt's current RN who agreed to work towards ordering pt's chest xray, Urine analysis, drug screening and BAL.

## 2019-10-06 NOTE — ED Triage Notes (Signed)
While triaging pt she states she is feeling better now and is not sure she needs to see a doctor.
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
## 2019-10-06 NOTE — ED Notes (Addendum)
Pt's daughter in to visit pt. Given update on pt and informed pt has been referred for inpatient admission. She verbalized understanding. She was also given visitation and phone guidelines. Pt observed ignoring her and telling her to get out of the room because she was talking to someone else. Pt in room staring at wall and can be overheard talking in room. Eventually came out to speak with daughter.Pt consented that her daughter can take her personal belongings, so the daughter given 1bag with pt's personal belongings to take home.

## 2019-10-06 NOTE — ED Notes (Signed)
Dr. Vonzella Nipple that Patient transfer to the [facility], she is going to room 3 with escort, Nurse called report to Earlie Server RN that will be resuming care.

## 2019-10-06 NOTE — BH Assessment (Addendum)
Referral information for Psychiatric Hospitalization faxed to:   [facility] [phone])   [place] [phone]),    [place] [phone] or [phone]),   . [facility] [phone] -or- [phone]),   . [facility] [phone]),

## 2019-10-06 NOTE — ED Notes (Addendum)
Pt denies SI/HI/AVH on assessment. Disorganized though process with flight of ideas.

## 2019-10-06 NOTE — ED Notes (Addendum)
Patient is being transported by w/c to for a CT scan.

## 2019-10-06 NOTE — ED Notes (Signed)
Pt cooperating with venipuncture at this time but not ekg.

## 2019-10-06 NOTE — ED Provider Notes (Addendum)
-----------------------------------------   8:17 PM on 10/06/2019 -----------------------------------------  UA is now resulted and appears grossly infected.  We will send urine for culture and start patient on 5-day course of Keflex.  Patient continues to await placement.   Blake Divine, MD 10/06/19 2018  ----------------------------------------- 11:11 PM on 10/06/2019 -----------------------------------------  Patient is increasingly anxious and stating she sees people watching her in her room.  We will add low-dose as needed Ativan to assist with anxiety and sleep.    Blake Divine, MD 10/06/19 2311
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
## 2019-10-06 NOTE — ED Notes (Signed)
Pt is refusing blood work and ekg at this time.

## 2019-10-06 NOTE — ED Notes (Signed)
Dr. Wynonia Lawman evaluating Patient, Patient remains calm and cooperative.
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
## 2019-10-06 NOTE — ED Notes (Signed)
Pt given breakfast tray

## 2019-10-06 NOTE — ED Triage Notes (Signed)
Pt states she is having lower back pain tonight and this morning. Pt states she felt like she was going to pass out yesterday or the day before she thinks when she was h aving an episode of diarrhea. Pt denies feeling syncopal now. Pt denies known fever, hematuria, dysuria. Pt is alert and oriented to place, time and self.

## 2019-10-06 NOTE — ED Notes (Signed)
Pt was given a warm blanket

## 2019-10-07 DIAGNOSIS — F29 Unspecified psychosis not due to a substance or known physiological condition: Secondary | ICD-10-CM | POA: Insufficient documentation

## 2019-10-07 LAB — URINE CULTURE: Culture: NO GROWTH

## 2019-10-07 NOTE — ED Notes (Signed)
Pt refusing to go to her room stating there is something in her room that is like a shadow. Stating it wants to hurt her and her daughter. Md asked for medication to give pt to help relax her as pt is becoming agitated.

## 2019-10-07 NOTE — ED Provider Notes (Signed)
Patient being transferred to [facility]   Nance Pear, MD 10/07/19 1237

## 2019-10-07 NOTE — ED Notes (Signed)
This RN and Maudie Mercury, RN able to get pt to go into her room at this time and lay on the bed. Explained to the pt that we needed to give her medication to help her calm down and rest. Pt continues to not want the medication but did not offer resistance to the shots. Arms held go prevent movement while injection administered. Pt tolerated well. Security at bedside for safety.

## 2019-10-07 NOTE — ED Notes (Signed)
Chaplin here to speak with the patient at her request.

## 2019-10-07 NOTE — BH Assessment (Addendum)
Stanton Kidney is asking for report to be given upon transportation arrival to pick up pt   Patient has been accepted to [facility].  Patient assigned to: Bed 41B  Accepting physician is Dr. Alonna Minium.  Call report to [phone] Representative was [facility].    ER Staff is aware of it:  [facility] Aundria Mems, ER MD  Amy Patient's Nurse  Patient's Family/Support System (Daughter Augusta), [phone]) have been updated as well.   PATIENT IS SCHEDULED FOR ADMISSION ANYTIME TODAY

## 2019-10-07 NOTE — ED Notes (Signed)
[facility]  DEPT  CALLED  FOR  TRANSPORT  TO  [facility]

## 2019-10-07 NOTE — BH Assessment (Signed)
TTS faxed the information requested this morning to [facility]

## 2019-10-07 NOTE — ED Notes (Signed)
Numerous attempts by this RN to get pt to take oral medication ordered by MD for pt. Also numerous attempt by this RN to get pt to go into her room. Pt has walked into her room several times and comes right back out.

## 2019-10-07 NOTE — ED Notes (Signed)
Pt up to the bathroom then returned to bed.  

## 2019-10-08 DIAGNOSIS — Z8673 Personal history of transient ischemic attack (TIA), and cerebral infarction without residual deficits: Secondary | ICD-10-CM | POA: Insufficient documentation

## 2019-10-08 DIAGNOSIS — R42 Dizziness and giddiness: Secondary | ICD-10-CM | POA: Insufficient documentation

## 2019-10-08 DIAGNOSIS — E139 Other specified diabetes mellitus without complications: Secondary | ICD-10-CM | POA: Insufficient documentation

## 2019-10-08 DIAGNOSIS — Z8669 Personal history of other diseases of the nervous system and sense organs: Secondary | ICD-10-CM | POA: Insufficient documentation

## 2020-02-05 ENCOUNTER — Other Ambulatory Visit: Payer: Self-pay | Admitting: Internal Medicine

## 2020-02-05 DIAGNOSIS — Z1231 Encounter for screening mammogram for malignant neoplasm of breast: Secondary | ICD-10-CM

## 2020-04-21 ENCOUNTER — Ambulatory Visit
Admission: RE | Admit: 2020-04-21 | Discharge: 2020-04-21 | Disposition: A | Discharge status: Home / Self Care | Payer: Medicare HMO | Source: Ambulatory Visit | Attending: Internal Medicine | Admitting: Internal Medicine

## 2020-04-21 ENCOUNTER — Other Ambulatory Visit: Payer: Self-pay

## 2020-04-21 DIAGNOSIS — Z1231 Encounter for screening mammogram for malignant neoplasm of breast: Secondary | ICD-10-CM | POA: Diagnosis present

## 2020-08-25 ENCOUNTER — Ambulatory Visit: Payer: Medicare HMO | Admitting: Podiatry

## 2020-08-25 ENCOUNTER — Encounter: Payer: Self-pay | Admitting: Podiatry

## 2020-08-25 ENCOUNTER — Other Ambulatory Visit: Payer: Self-pay

## 2020-08-25 DIAGNOSIS — D2372 Other benign neoplasm of skin of left lower limb, including hip: Secondary | ICD-10-CM | POA: Diagnosis not present

## 2020-08-25 DIAGNOSIS — N951 Menopausal and female climacteric states: Secondary | ICD-10-CM | POA: Insufficient documentation

## 2020-08-25 DIAGNOSIS — B351 Tinea unguium: Secondary | ICD-10-CM

## 2020-08-25 DIAGNOSIS — M79676 Pain in unspecified toe(s): Secondary | ICD-10-CM

## 2020-08-25 DIAGNOSIS — L659 Nonscarring hair loss, unspecified: Secondary | ICD-10-CM | POA: Insufficient documentation

## 2020-08-25 DIAGNOSIS — D2371 Other benign neoplasm of skin of right lower limb, including hip: Secondary | ICD-10-CM

## 2020-08-25 NOTE — Progress Notes (Signed)
Subjective:  Patient ID: Holly [name], female    DOB: November 16, 1935,  MRN: [id number] HPI Chief Complaint  Patient presents with  . Debridement    Trim toenails and calluses    85 y.o. female presents with the above complaint.   ROS: Denies fever chills nausea vomiting muscle aches pains calf pain back pain chest pain shortness of breath  Past Medical History:  Diagnosis Date  . Bell's palsy 1985  . CVA (cerebral infarction)   . Diabetes mellitus without complication (HCC)    non insulin dependent  . Hypertension   . Vertigo    Past Surgical History:  Procedure Laterality Date  . BREAST EXCISIONAL BIOPSY Right 1964   neg  . BREAST SURGERY     Benign tumor  . CATARACT EXTRACTION Bilateral 02/2016  . thyroid nodules bx Bilateral 10/2015    [facility], Dr. Gabriel Carina  (negative)  . TONSILLECTOMY AND ADENOIDECTOMY      Current Outpatient Medications:  .  amLODipine (NORVASC) 2.5 MG tablet, Take 2.5 mg by mouth daily., Disp: , Rfl:  .  atenolol-chlorthalidone (TENORETIC) 100-25 MG tablet, Take 0.5 tablets by mouth daily., Disp: , Rfl:  .  atorvastatin (LIPITOR) 40 MG tablet, TAKE 1 TABLET BY MOUTH EVERY EVENING (Patient taking differently: Take 40 mg by mouth daily. ), Disp: 90 tablet, Rfl: 0 .  Biotin 2500 MCG CAPS, Take 2,500 mcg by mouth daily., Disp: , Rfl:  .  clopidogrel (PLAVIX) 75 MG tablet, TAKE 1 TABLET BY MOUTH EVERY DAY (Patient taking differently: Take 75 mg by mouth daily. ), Disp: 90 tablet, Rfl: 0 .  famotidine (PEPCID) 20 MG tablet, Take 1 tablet (20 mg total) by mouth daily., Disp: 30 tablet, Rfl: 1 .  haloperidol (HALDOL) 0.5 MG tablet, Take 0.5 mg by mouth at bedtime., Disp: , Rfl:  .  metFORMIN (GLUCOPHAGE) 500 MG tablet, Take 500 mg by mouth 2 (two) times daily with a meal., Disp: , Rfl:  .  metoprolol succinate (TOPROL-XL) 50 MG 24 hr tablet, Take 50 mg by mouth daily., Disp: , Rfl:   No Known Allergies Review of Systems Objective:  There were no vitals  filed for this visit.  General: Well developed, nourished, in no acute distress, alert and oriented x3   Dermatological: Skin is warm, dry and supple bilateral. Nails x 10 are thick yellow dystrophic and clinically mycotic.  Remaining integument appears unremarkable at this time. There are no open sores, no preulcerative lesions, no rash or signs of infection present.  Multiple reactive hyperkeratotic lesions plantar aspect of the bilateral foot.  Vascular: Dorsalis Pedis artery and Posterior Tibial artery pedal pulses are 2/4 bilateral with immedate capillary fill time. Pedal hair growth present. No varicosities and no lower extremity edema present bilateral.   Neruologic: Grossly intact via light touch bilateral. Vibratory intact via tuning fork bilateral. Protective threshold with Semmes Wienstein monofilament intact to all pedal sites bilateral. Patellar and Achilles deep tendon reflexes 2+ bilateral. No Babinski or clonus noted bilateral.   Musculoskeletal: No gross boney pedal deformities bilateral. No pain, crepitus, or limitation noted with foot and ankle range of motion bilateral. Muscular strength 5/5 in all groups tested bilateral.  Gait: Unassisted, Nonantalgic.    Radiographs:    Assessment & Plan:   Assessment: Benign skin lesions plantar aspect of the bilateral foot.  Pain in limb secondary to onychomycosis.  Plan: Debridement of nails 1 through 5 bilateral and debridement of benign skin lesions bilateral.  Garrel Ridgel, DPM

## 2020-12-30 ENCOUNTER — Ambulatory Visit: Payer: Medicare HMO | Admitting: Podiatry

## 2021-06-06 ENCOUNTER — Other Ambulatory Visit: Payer: Self-pay | Admitting: Internal Medicine

## 2021-06-06 DIAGNOSIS — Z1231 Encounter for screening mammogram for malignant neoplasm of breast: Secondary | ICD-10-CM

## 2021-07-12 ENCOUNTER — Ambulatory Visit
Admission: RE | Admit: 2021-07-12 | Discharge: 2021-07-12 | Disposition: A | Discharge status: Home / Self Care | Payer: Medicare HMO | Source: Ambulatory Visit | Attending: Internal Medicine | Admitting: Internal Medicine

## 2021-07-12 ENCOUNTER — Other Ambulatory Visit: Payer: Self-pay

## 2021-07-12 DIAGNOSIS — Z1231 Encounter for screening mammogram for malignant neoplasm of breast: Secondary | ICD-10-CM | POA: Insufficient documentation

## 2021-10-26 ENCOUNTER — Ambulatory Visit: Payer: Medicare HMO | Admitting: Podiatry

## 2021-10-26 ENCOUNTER — Encounter: Payer: Self-pay | Admitting: Podiatry

## 2021-10-26 DIAGNOSIS — B351 Tinea unguium: Secondary | ICD-10-CM | POA: Diagnosis not present

## 2021-10-26 DIAGNOSIS — D2372 Other benign neoplasm of skin of left lower limb, including hip: Secondary | ICD-10-CM | POA: Diagnosis not present

## 2021-10-26 DIAGNOSIS — M79676 Pain in unspecified toe(s): Secondary | ICD-10-CM | POA: Diagnosis not present

## 2021-10-26 DIAGNOSIS — D2371 Other benign neoplasm of skin of right lower limb, including hip: Secondary | ICD-10-CM

## 2021-10-26 NOTE — Progress Notes (Signed)
She presents today after having not seen her for a a year.  She is unconcerned about some spider veins to the bilateral lower extremity states that she has seen Dr. Lucky Cowboy in the past for similar findings on her thigh.  She is also concerned about long toenails and calluses.  Objective: Vital signs are stable she is alert and oriented x3.  Pulses are palpable.  She has some mild edema to the bilateral lower extremity mild varicosities with several patches of very small red spider veins no open lesions or wounds associated with these.  Toenails are long thick yellow dystrophic with mycotic multiple benign skin lesions plantar aspect of the bilateral foot.  Assessment: Small spider varicosities bilateral ankles.  Mild edema bilateral.  Pain in limb secondary onychomycosis bilateral.  Benign skin lesions plantar aspect forefoot bilateral.  Plan: Recommended that she follow-up with vein and vascular.  Also recommended nail debridement which we did today.  Debrided benign skin lesions as well.  Follow-up with her on an as-needed basis.

## 2021-12-27 DIAGNOSIS — N1831 Chronic kidney disease, stage 3a: Secondary | ICD-10-CM | POA: Insufficient documentation

## 2022-01-30 ENCOUNTER — Encounter (INDEPENDENT_AMBULATORY_CARE_PROVIDER_SITE_OTHER): Payer: Medicare HMO

## 2022-01-30 ENCOUNTER — Encounter (INDEPENDENT_AMBULATORY_CARE_PROVIDER_SITE_OTHER): Payer: Medicare HMO | Admitting: Vascular Surgery

## 2022-02-20 ENCOUNTER — Encounter (INDEPENDENT_AMBULATORY_CARE_PROVIDER_SITE_OTHER): Payer: Self-pay

## 2022-02-22 ENCOUNTER — Encounter (INDEPENDENT_AMBULATORY_CARE_PROVIDER_SITE_OTHER): Payer: Medicare HMO

## 2022-02-22 ENCOUNTER — Encounter (INDEPENDENT_AMBULATORY_CARE_PROVIDER_SITE_OTHER): Payer: Medicare HMO | Admitting: Nurse Practitioner

## 2022-03-08 NOTE — Progress Notes (Unsigned)
MRN : [id number]  Holly [name] is a 86 y.o. (03/03/1936) female who presents with chief complaint of varicose veins hurt.  History of Present Illness:   The patient is seen for evaluation of symptomatic varicose veins. The patient relates burning and stinging which worsened steadily throughout the course of the day, particularly with standing. The patient also notes an aching and throbbing pain over the varicosities, particularly with prolonged dependent positions. The symptoms are significantly improved with elevation.  The patient also notes that during hot weather the symptoms are greatly intensified. The patient states the pain from the varicose veins interferes with work, daily exercise, shopping and household maintenance. At this point, the symptoms are persistent and severe enough that they're having a negative impact on lifestyle and are interfering with daily activities.  There is no history of prior surgical intervention but she has had some sclerotherapy many years ago.  There is no history of DVT, PE or superficial thrombophlebitis. There is no history of ulceration or hemorrhage. The patient denies a significant family history of varicose veins.  The patient has not worn graduated compression on a routine basis. At the present time the patient has not been using over-the-counter analgesics.   Duplex ultrasound of the venous system bilateral lower extremities demonstrates a normal deep venous system.  There is superficial reflux identified in the proximal great saphenous vein on the left.  No superficial reflux is noted on the right.  No outpatient medications have been marked as taking for the 03/09/22 encounter (Appointment) with Delana Meyer, Dolores Lory, MD.    Past Medical History:  Diagnosis Date   Bell's palsy 1985   CVA (cerebral infarction)    Diabetes mellitus without complication ([place])    non insulin dependent   Hypertension    Vertigo     Past Surgical  History:  Procedure Laterality Date   BREAST EXCISIONAL BIOPSY Right 1964   neg   BREAST SURGERY     Benign tumor   CATARACT EXTRACTION Bilateral 02/2016   thyroid nodules bx Bilateral 10/2015    [facility], Dr. Gabriel Carina  (negative)   TONSILLECTOMY AND ADENOIDECTOMY      Social History Social History   Tobacco Use   Smoking status: Never   Smokeless tobacco: Never  Substance Use Topics   Alcohol use: No   Drug use: No    Family History Family History  Problem Relation Age of Onset   Cancer Mother        Unsure of type - "female"   Diabetes Father    Lung cancer Brother     No Known Allergies   REVIEW OF SYSTEMS (Negative unless checked)  Constitutional: '[]'$ Weight loss  '[]'$ Fever  '[]'$ Chills Cardiac: '[]'$ Chest pain   '[]'$ Chest pressure   '[]'$ Palpitations   '[]'$ Shortness of breath when laying flat   '[]'$ Shortness of breath with exertion. Vascular:  '[]'$ Pain in legs with walking   '[x]'$ Pain in legs with standing  '[]'$ History of DVT   '[]'$ Phlebitis   '[]'$ Swelling in legs   '[x]'$ Varicose veins   '[]'$ Non-healing ulcers Pulmonary:   '[]'$ Uses home oxygen   '[]'$ Productive cough   '[]'$ Hemoptysis   '[]'$ Wheeze  '[]'$ COPD   '[]'$ Asthma Neurologic:  '[]'$ Dizziness   '[]'$ Seizures   '[]'$ History of stroke   '[]'$ History of TIA  '[]'$ Aphasia   '[]'$ Vissual changes   '[]'$ Weakness or numbness in arm   '[]'$ Weakness or numbness in leg Musculoskeletal:   '[]'$ Joint swelling   '[]'$ Joint pain   '[]'$ Low back pain  Hematologic:  '[]'$ Easy bruising  '[]'$ Easy bleeding   '[]'$ Hypercoagulable state   '[]'$ Anemic Gastrointestinal:  '[]'$ Diarrhea   '[]'$ Vomiting  '[]'$ Gastroesophageal reflux/heartburn   '[]'$ Difficulty swallowing. Genitourinary:  '[]'$ Chronic kidney disease   '[]'$ Difficult urination  '[]'$ Frequent urination   '[]'$ Blood in urine Skin:  '[]'$ Rashes   '[]'$ Ulcers  Psychological:  '[]'$ History of anxiety   '[]'$  History of major depression.  Physical Examination  There were no vitals filed for this visit. There is no height or weight on file to calculate BMI. Gen: WD/WN, NAD Head: /AT, No  temporalis wasting.  Ear/Nose/Throat: Hearing grossly intact, nares w/o erythema or drainage, pinna without lesions Eyes: PER, EOMI, sclera nonicteric.  Neck: Supple, no gross masses.  No JVD.  Pulmonary:  Good air movement, no audible wheezing, no use of accessory muscles.  Cardiac: RRR, precordium not hyperdynamic. Vascular:  Varicosities present, greater than 4-6 mm left greater than right.  Veins are tender to palpation  Mild venous stasis changes to the legs bilaterally.  Trace soft pitting edema CEAP [id number] Vessel Right Left  Radial Palpable Palpable  Gastrointestinal: soft, non-distended. No guarding/no peritoneal signs.  Musculoskeletal: M/S 5/5 throughout.  No deformity.  Neurologic: CN 2-12 intact. Pain and light touch intact in extremities.  Symmetrical.  Speech is fluent. Motor exam as listed above. Psychiatric: Judgment intact, Mood & affect appropriate for pt's clinical situation. Dermatologic: Venous rashes no ulcers noted.  No changes consistent with cellulitis. Lymph : No lichenification or skin changes of chronic lymphedema.  CBC Lab Results  Component Value Date   WBC 7.1 10/06/2019   HGB 11.8 (L) 10/06/2019   HCT 36.7 10/06/2019   MCV 91.1 10/06/2019   PLT 214 10/06/2019    BMET    Component Value Date/Time   NA 143 10/06/2019 0603   K 3.3 (L) 10/06/2019 0603   CL 108 10/06/2019 0603   CO2 26 10/06/2019 0603   GLUCOSE 161 (H) 10/06/2019 0603   BUN 25 (H) 10/06/2019 0603   CREATININE 0.90 10/06/2019 0603   CALCIUM 8.7 (L) 10/06/2019 0603   GFRNONAA 60 (L) 10/06/2019 0603   GFRAA >60 10/06/2019 0603   CrCl cannot be calculated (Patient's most recent lab result is older than the maximum 21 days allowed.).  COAG Lab Results  Component Value Date   INR 1.0 09/27/2019   INR 1.05 08/27/2014    Radiology No results found.   Assessment/Plan 1. Varicose veins with pain  Recommend:  The patient has large symptomatic varicose veins that are painful  and associated with swelling.  I have had a discussion with the patient regarding  varicose veins and why they cause symptoms.  Patient will begin wearing graduated compression stockings class 1 on a daily basis, beginning first thing in the morning and removing them in the evening. The patient is instructed specifically not to sleep in the stockings.    The patient  will also begin using over-the-counter analgesics such as Motrin 600 mg po TID to help control the symptoms.    In addition, behavioral modification including elevation during the day will be initiated.    Duplex ultrasound of the venous system bilateral lower extremities demonstrates a normal deep venous system.  There is superficial reflux identified in the proximal great saphenous vein on the left.  No superficial reflux is noted on the right.  2. Vertebral artery dissection (HCC) Recommend:  Given the patient's asymptomatic subcritical stenosis no further invasive testing or surgery at this time.  CT angiogram 08/27/2014  The right  vertebral artery is patent and dominant without stenosis. The left vertebral artery is patent proximally but demonstrates progressively diminishing opacification and caliber throughout the V2 segment and becomes completely occluded at C2.  Continue antiplatelet therapy as prescribed Continue management of CAD, HTN and Hyperlipidemia Healthy heart diet,  encouraged exercise at least 4 times per week  Given the stable <30% right vertebral and bilateral carotid stenosis in association with the patient's age the patient will follow up PRN.  The patient is told that if symptoms of a TIA should occur then he should go to the ER and I should be notified, as this would change the management course.  The patient voices understanding.  3. Essential hypertension Continue antihypertensive medications as already ordered, these medications have been reviewed and there are no changes at this time.  4.  Controlled type 2 diabetes mellitus without complication, without long-term current use of insulin ([place]) Continue hypoglycemic medications as already ordered, these medications have been reviewed and there are no changes at this time.  Hgb A1C to be monitored as already arranged by primary service  5. Hyperlipidemia LDL goal <70 Continue statin as ordered and reviewed, no changes at this time    Hortencia Pilar, MD  03/08/2022 1:20 PM

## 2022-03-09 ENCOUNTER — Encounter (INDEPENDENT_AMBULATORY_CARE_PROVIDER_SITE_OTHER): Payer: Self-pay | Admitting: Vascular Surgery

## 2022-03-09 ENCOUNTER — Other Ambulatory Visit: Payer: Self-pay | Admitting: Vascular Surgery

## 2022-03-09 ENCOUNTER — Ambulatory Visit (INDEPENDENT_AMBULATORY_CARE_PROVIDER_SITE_OTHER): Payer: Medicare HMO | Admitting: Vascular Surgery

## 2022-03-09 ENCOUNTER — Ambulatory Visit (INDEPENDENT_AMBULATORY_CARE_PROVIDER_SITE_OTHER): Payer: Medicare HMO

## 2022-03-09 VITALS — BP 181/68 | HR 55 | Resp 15 | Wt 135.4 lb

## 2022-03-09 DIAGNOSIS — I10 Essential (primary) hypertension: Secondary | ICD-10-CM | POA: Diagnosis not present

## 2022-03-09 DIAGNOSIS — I83893 Varicose veins of bilateral lower extremities with other complications: Secondary | ICD-10-CM

## 2022-03-09 DIAGNOSIS — I83819 Varicose veins of unspecified lower extremities with pain: Secondary | ICD-10-CM

## 2022-03-09 DIAGNOSIS — I7774 Dissection of vertebral artery: Secondary | ICD-10-CM | POA: Diagnosis not present

## 2022-03-09 DIAGNOSIS — E119 Type 2 diabetes mellitus without complications: Secondary | ICD-10-CM | POA: Diagnosis not present

## 2022-03-09 DIAGNOSIS — E785 Hyperlipidemia, unspecified: Secondary | ICD-10-CM

## 2022-07-12 ENCOUNTER — Encounter: Payer: Self-pay | Admitting: Podiatry

## 2022-07-12 ENCOUNTER — Ambulatory Visit: Payer: Medicare HMO | Admitting: Podiatry

## 2022-07-12 DIAGNOSIS — D2371 Other benign neoplasm of skin of right lower limb, including hip: Secondary | ICD-10-CM

## 2022-07-12 DIAGNOSIS — D2372 Other benign neoplasm of skin of left lower limb, including hip: Secondary | ICD-10-CM | POA: Diagnosis not present

## 2022-07-12 DIAGNOSIS — B351 Tinea unguium: Secondary | ICD-10-CM | POA: Diagnosis not present

## 2022-07-12 DIAGNOSIS — M79676 Pain in unspecified toe(s): Secondary | ICD-10-CM

## 2022-07-12 NOTE — Progress Notes (Signed)
She presents today chief complaint of painful elongated toenails and calluses bilaterally.  Objective: Pulses are strong and palpable hammertoe deformities are noted bilateral multiple benign skin lesions bilateral with painful elongated toenails bilateral.  Assessment: Pain in limb secondary to onychomycosis benign skin lesions.  Plan: Debridement of benign skin lesions debridement of toenails 1 through 5 bilateral.

## 2023-01-17 ENCOUNTER — Encounter: Payer: Self-pay | Admitting: Podiatry

## 2023-01-17 ENCOUNTER — Ambulatory Visit: Payer: Medicare HMO | Admitting: Podiatry

## 2023-01-17 DIAGNOSIS — B351 Tinea unguium: Secondary | ICD-10-CM

## 2023-01-17 DIAGNOSIS — D2372 Other benign neoplasm of skin of left lower limb, including hip: Secondary | ICD-10-CM | POA: Diagnosis not present

## 2023-01-17 DIAGNOSIS — D2371 Other benign neoplasm of skin of right lower limb, including hip: Secondary | ICD-10-CM

## 2023-01-17 DIAGNOSIS — M79676 Pain in unspecified toe(s): Secondary | ICD-10-CM | POA: Diagnosis not present

## 2023-01-17 NOTE — Progress Notes (Addendum)
She presents today chief complaint of painful elongated toenails and calluses bilaterally.  Objective: Pulses are strong and palpable hammertoe deformities are noted bilateral multiple benign skin lesions bilateral with painful elongated toenails bilateral.  Assessment: Pain in limb secondary to onychomycosis benign skin lesions.  Plan: Debridement of benign skin lesions debridement of toenails 1 through 5 bilateral.

## 2023-04-23 ENCOUNTER — Ambulatory Visit: Payer: Medicare HMO | Admitting: Podiatry

## 2023-07-11 ENCOUNTER — Encounter: Payer: Self-pay | Admitting: Podiatry

## 2023-07-11 ENCOUNTER — Ambulatory Visit: Payer: Medicare HMO | Admitting: Podiatry

## 2023-07-11 DIAGNOSIS — M79676 Pain in unspecified toe(s): Secondary | ICD-10-CM

## 2023-07-11 DIAGNOSIS — D2372 Other benign neoplasm of skin of left lower limb, including hip: Secondary | ICD-10-CM

## 2023-07-11 DIAGNOSIS — D2371 Other benign neoplasm of skin of right lower limb, including hip: Secondary | ICD-10-CM

## 2023-07-11 DIAGNOSIS — B351 Tinea unguium: Secondary | ICD-10-CM

## 2023-07-11 NOTE — Progress Notes (Signed)
She presents today chief complaint of painful elongated toenails and calluses bilaterally.  Objective: Pulses are strong and palpable hammertoe deformities are noted bilateral multiple benign skin lesions bilateral with painful elongated toenails bilateral.  Assessment: Pain in limb secondary to onychomycosis benign skin lesions.  Plan: Debridement of benign skin lesions debridement of toenails 1 through 5 bilateral.

## 2023-11-19 ENCOUNTER — Ambulatory Visit: Admitting: Podiatry

## 2024-01-14 ENCOUNTER — Encounter: Payer: Self-pay | Admitting: Podiatry

## 2024-01-14 ENCOUNTER — Ambulatory Visit: Admitting: Podiatry

## 2024-01-14 DIAGNOSIS — B351 Tinea unguium: Secondary | ICD-10-CM | POA: Diagnosis not present

## 2024-01-14 DIAGNOSIS — M79676 Pain in unspecified toe(s): Secondary | ICD-10-CM | POA: Diagnosis not present

## 2024-01-14 NOTE — Progress Notes (Signed)
She presents today chief complaint of painful elongated toenails and calluses bilaterally.  Objective: Pulses are strong and palpable hammertoe deformities are noted bilateral multiple benign skin lesions bilateral with painful elongated toenails bilateral.  Assessment: Pain in limb secondary to onychomycosis benign skin lesions.  Plan: Debridement of benign skin lesions debridement of toenails 1 through 5 bilateral.

## 2024-07-14 ENCOUNTER — Ambulatory Visit: Admitting: Podiatry
